# Patient Record
Sex: Female | Born: 1940 | Hispanic: No | Marital: Married | Smoking: Former smoker
Health system: Southern US, Community
[De-identification: ages and names within clinical notes are randomized; demographics above are authoritative.]

## PROBLEM LIST (undated history)

## (undated) DIAGNOSIS — I10 Essential (primary) hypertension: Secondary | ICD-10-CM

## (undated) DIAGNOSIS — Z8719 Personal history of other diseases of the digestive system: Secondary | ICD-10-CM

## (undated) DIAGNOSIS — E119 Type 2 diabetes mellitus without complications: Secondary | ICD-10-CM

## (undated) DIAGNOSIS — F32A Depression, unspecified: Secondary | ICD-10-CM

## (undated) DIAGNOSIS — E669 Obesity, unspecified: Secondary | ICD-10-CM

## (undated) DIAGNOSIS — F419 Anxiety disorder, unspecified: Secondary | ICD-10-CM

## (undated) DIAGNOSIS — K219 Gastro-esophageal reflux disease without esophagitis: Secondary | ICD-10-CM

## (undated) DIAGNOSIS — F0390 Unspecified dementia without behavioral disturbance: Secondary | ICD-10-CM

## (undated) DIAGNOSIS — G4700 Insomnia, unspecified: Secondary | ICD-10-CM

## (undated) DIAGNOSIS — J449 Chronic obstructive pulmonary disease, unspecified: Secondary | ICD-10-CM

## (undated) HISTORY — DX: Essential (primary) hypertension: I10

## (undated) HISTORY — DX: Obesity, unspecified: E66.9

## (undated) HISTORY — DX: Type 2 diabetes mellitus without complications: E11.9

## (undated) HISTORY — PX: TOTAL ABDOMINAL HYSTERECTOMY W/ BILATERAL SALPINGOOPHORECTOMY: SHX83

## (undated) HISTORY — DX: Unspecified dementia, unspecified severity, without behavioral disturbance, psychotic disturbance, mood disturbance, and anxiety: F03.90

## (undated) HISTORY — DX: Insomnia, unspecified: G47.00

## (undated) HISTORY — DX: Anxiety disorder, unspecified: F41.9

## (undated) HISTORY — DX: Personal history of other diseases of the digestive system: Z87.19

## (undated) HISTORY — DX: Gastro-esophageal reflux disease without esophagitis: K21.9

## (undated) HISTORY — PX: APPENDECTOMY: SHX54

## (undated) HISTORY — DX: Chronic obstructive pulmonary disease, unspecified: J44.9

## (undated) HISTORY — DX: Depression, unspecified: F32.A

---

## 2010-01-08 HISTORY — PX: ESOPHAGOGASTRODUODENOSCOPY: SHX1529

## 2010-01-10 HISTORY — PX: COLONOSCOPY: SHX174

## 2016-06-16 DIAGNOSIS — E119 Type 2 diabetes mellitus without complications: Secondary | ICD-10-CM | POA: Diagnosis not present

## 2016-06-16 DIAGNOSIS — E785 Hyperlipidemia, unspecified: Secondary | ICD-10-CM | POA: Diagnosis not present

## 2016-06-16 DIAGNOSIS — I10 Essential (primary) hypertension: Secondary | ICD-10-CM | POA: Diagnosis not present

## 2016-06-16 DIAGNOSIS — R413 Other amnesia: Secondary | ICD-10-CM | POA: Diagnosis not present

## 2016-06-16 DIAGNOSIS — I639 Cerebral infarction, unspecified: Secondary | ICD-10-CM | POA: Diagnosis not present

## 2016-06-16 DIAGNOSIS — J301 Allergic rhinitis due to pollen: Secondary | ICD-10-CM | POA: Diagnosis not present

## 2016-06-16 DIAGNOSIS — K210 Gastro-esophageal reflux disease with esophagitis: Secondary | ICD-10-CM | POA: Diagnosis not present

## 2016-06-16 DIAGNOSIS — Z1231 Encounter for screening mammogram for malignant neoplasm of breast: Secondary | ICD-10-CM | POA: Diagnosis not present

## 2016-06-16 DIAGNOSIS — E063 Autoimmune thyroiditis: Secondary | ICD-10-CM | POA: Diagnosis not present

## 2016-12-16 DIAGNOSIS — E119 Type 2 diabetes mellitus without complications: Secondary | ICD-10-CM | POA: Diagnosis not present

## 2016-12-16 DIAGNOSIS — Z6835 Body mass index (BMI) 35.0-35.9, adult: Secondary | ICD-10-CM | POA: Diagnosis not present

## 2016-12-16 DIAGNOSIS — E669 Obesity, unspecified: Secondary | ICD-10-CM | POA: Diagnosis not present

## 2016-12-16 DIAGNOSIS — I10 Essential (primary) hypertension: Secondary | ICD-10-CM | POA: Diagnosis not present

## 2016-12-16 DIAGNOSIS — K210 Gastro-esophageal reflux disease with esophagitis: Secondary | ICD-10-CM | POA: Diagnosis not present

## 2016-12-16 DIAGNOSIS — E785 Hyperlipidemia, unspecified: Secondary | ICD-10-CM | POA: Diagnosis not present

## 2016-12-16 DIAGNOSIS — Z23 Encounter for immunization: Secondary | ICD-10-CM | POA: Diagnosis not present

## 2016-12-16 DIAGNOSIS — R413 Other amnesia: Secondary | ICD-10-CM | POA: Diagnosis not present

## 2016-12-16 DIAGNOSIS — E063 Autoimmune thyroiditis: Secondary | ICD-10-CM | POA: Diagnosis not present

## 2016-12-18 DIAGNOSIS — Z1231 Encounter for screening mammogram for malignant neoplasm of breast: Secondary | ICD-10-CM | POA: Diagnosis not present

## 2017-04-12 DIAGNOSIS — T148XXA Other injury of unspecified body region, initial encounter: Secondary | ICD-10-CM | POA: Diagnosis not present

## 2017-04-12 DIAGNOSIS — S20219A Contusion of unspecified front wall of thorax, initial encounter: Secondary | ICD-10-CM | POA: Diagnosis not present

## 2017-04-12 DIAGNOSIS — S161XXA Strain of muscle, fascia and tendon at neck level, initial encounter: Secondary | ICD-10-CM | POA: Diagnosis not present

## 2017-04-12 DIAGNOSIS — G44209 Tension-type headache, unspecified, not intractable: Secondary | ICD-10-CM | POA: Diagnosis not present

## 2017-04-12 DIAGNOSIS — M25569 Pain in unspecified knee: Secondary | ICD-10-CM | POA: Diagnosis not present

## 2017-04-12 DIAGNOSIS — S20212A Contusion of left front wall of thorax, initial encounter: Secondary | ICD-10-CM | POA: Diagnosis not present

## 2017-04-16 DIAGNOSIS — Z79899 Other long term (current) drug therapy: Secondary | ICD-10-CM | POA: Diagnosis not present

## 2017-04-16 DIAGNOSIS — M818 Other osteoporosis without current pathological fracture: Secondary | ICD-10-CM | POA: Diagnosis not present

## 2017-04-16 DIAGNOSIS — K210 Gastro-esophageal reflux disease with esophagitis: Secondary | ICD-10-CM | POA: Diagnosis not present

## 2017-04-16 DIAGNOSIS — R413 Other amnesia: Secondary | ICD-10-CM | POA: Diagnosis not present

## 2017-04-16 DIAGNOSIS — E063 Autoimmune thyroiditis: Secondary | ICD-10-CM | POA: Diagnosis not present

## 2017-04-16 DIAGNOSIS — E119 Type 2 diabetes mellitus without complications: Secondary | ICD-10-CM | POA: Diagnosis not present

## 2017-04-16 DIAGNOSIS — E785 Hyperlipidemia, unspecified: Secondary | ICD-10-CM | POA: Diagnosis not present

## 2017-04-16 DIAGNOSIS — I10 Essential (primary) hypertension: Secondary | ICD-10-CM | POA: Diagnosis not present

## 2017-04-16 DIAGNOSIS — I6529 Occlusion and stenosis of unspecified carotid artery: Secondary | ICD-10-CM | POA: Diagnosis not present

## 2017-04-22 DIAGNOSIS — I6521 Occlusion and stenosis of right carotid artery: Secondary | ICD-10-CM | POA: Diagnosis not present

## 2017-04-22 DIAGNOSIS — I639 Cerebral infarction, unspecified: Secondary | ICD-10-CM | POA: Diagnosis not present

## 2017-04-30 DIAGNOSIS — I639 Cerebral infarction, unspecified: Secondary | ICD-10-CM | POA: Diagnosis not present

## 2017-04-30 DIAGNOSIS — I10 Essential (primary) hypertension: Secondary | ICD-10-CM | POA: Diagnosis not present

## 2017-04-30 DIAGNOSIS — R413 Other amnesia: Secondary | ICD-10-CM | POA: Diagnosis not present

## 2017-04-30 DIAGNOSIS — Z6831 Body mass index (BMI) 31.0-31.9, adult: Secondary | ICD-10-CM | POA: Diagnosis not present

## 2017-04-30 DIAGNOSIS — E785 Hyperlipidemia, unspecified: Secondary | ICD-10-CM | POA: Diagnosis not present

## 2017-04-30 DIAGNOSIS — E063 Autoimmune thyroiditis: Secondary | ICD-10-CM | POA: Diagnosis not present

## 2017-04-30 DIAGNOSIS — K210 Gastro-esophageal reflux disease with esophagitis: Secondary | ICD-10-CM | POA: Diagnosis not present

## 2017-05-06 DIAGNOSIS — R112 Nausea with vomiting, unspecified: Secondary | ICD-10-CM | POA: Diagnosis not present

## 2017-05-06 DIAGNOSIS — N3000 Acute cystitis without hematuria: Secondary | ICD-10-CM | POA: Diagnosis not present

## 2017-05-06 DIAGNOSIS — R51 Headache: Secondary | ICD-10-CM | POA: Diagnosis not present

## 2017-05-06 DIAGNOSIS — I10 Essential (primary) hypertension: Secondary | ICD-10-CM | POA: Diagnosis not present

## 2017-05-06 DIAGNOSIS — F0390 Unspecified dementia without behavioral disturbance: Secondary | ICD-10-CM | POA: Diagnosis not present

## 2017-05-06 DIAGNOSIS — K449 Diaphragmatic hernia without obstruction or gangrene: Secondary | ICD-10-CM | POA: Diagnosis not present

## 2017-05-10 DIAGNOSIS — F22 Delusional disorders: Secondary | ICD-10-CM | POA: Diagnosis not present

## 2017-05-10 DIAGNOSIS — N183 Chronic kidney disease, stage 3 (moderate): Secondary | ICD-10-CM | POA: Diagnosis not present

## 2017-05-10 DIAGNOSIS — E785 Hyperlipidemia, unspecified: Secondary | ICD-10-CM | POA: Diagnosis not present

## 2017-05-10 DIAGNOSIS — I639 Cerebral infarction, unspecified: Secondary | ICD-10-CM | POA: Diagnosis not present

## 2017-05-10 DIAGNOSIS — Z6830 Body mass index (BMI) 30.0-30.9, adult: Secondary | ICD-10-CM | POA: Diagnosis not present

## 2017-05-10 DIAGNOSIS — E063 Autoimmune thyroiditis: Secondary | ICD-10-CM | POA: Diagnosis not present

## 2017-05-10 DIAGNOSIS — R413 Other amnesia: Secondary | ICD-10-CM | POA: Diagnosis not present

## 2017-05-19 ENCOUNTER — Encounter: Payer: Self-pay | Admitting: Gastroenterology

## 2017-05-19 DIAGNOSIS — E538 Deficiency of other specified B group vitamins: Secondary | ICD-10-CM | POA: Diagnosis not present

## 2017-05-19 DIAGNOSIS — K210 Gastro-esophageal reflux disease with esophagitis: Secondary | ICD-10-CM | POA: Diagnosis not present

## 2017-05-19 DIAGNOSIS — D649 Anemia, unspecified: Secondary | ICD-10-CM | POA: Diagnosis not present

## 2017-05-19 DIAGNOSIS — E1140 Type 2 diabetes mellitus with diabetic neuropathy, unspecified: Secondary | ICD-10-CM | POA: Diagnosis not present

## 2017-05-19 DIAGNOSIS — R413 Other amnesia: Secondary | ICD-10-CM | POA: Diagnosis not present

## 2017-05-19 DIAGNOSIS — E063 Autoimmune thyroiditis: Secondary | ICD-10-CM | POA: Diagnosis not present

## 2017-05-19 DIAGNOSIS — I10 Essential (primary) hypertension: Secondary | ICD-10-CM | POA: Diagnosis not present

## 2017-05-19 DIAGNOSIS — E118 Type 2 diabetes mellitus with unspecified complications: Secondary | ICD-10-CM | POA: Diagnosis not present

## 2017-05-19 DIAGNOSIS — E785 Hyperlipidemia, unspecified: Secondary | ICD-10-CM | POA: Diagnosis not present

## 2017-05-19 DIAGNOSIS — F22 Delusional disorders: Secondary | ICD-10-CM | POA: Diagnosis not present

## 2017-05-19 DIAGNOSIS — N183 Chronic kidney disease, stage 3 (moderate): Secondary | ICD-10-CM | POA: Diagnosis not present

## 2017-06-22 DIAGNOSIS — E063 Autoimmune thyroiditis: Secondary | ICD-10-CM | POA: Diagnosis not present

## 2017-06-22 DIAGNOSIS — E118 Type 2 diabetes mellitus with unspecified complications: Secondary | ICD-10-CM | POA: Diagnosis not present

## 2017-06-22 DIAGNOSIS — D649 Anemia, unspecified: Secondary | ICD-10-CM | POA: Diagnosis not present

## 2017-06-22 DIAGNOSIS — E1140 Type 2 diabetes mellitus with diabetic neuropathy, unspecified: Secondary | ICD-10-CM | POA: Diagnosis not present

## 2017-06-22 DIAGNOSIS — E785 Hyperlipidemia, unspecified: Secondary | ICD-10-CM | POA: Diagnosis not present

## 2017-06-22 DIAGNOSIS — K210 Gastro-esophageal reflux disease with esophagitis: Secondary | ICD-10-CM | POA: Diagnosis not present

## 2017-06-22 DIAGNOSIS — I10 Essential (primary) hypertension: Secondary | ICD-10-CM | POA: Diagnosis not present

## 2017-06-22 DIAGNOSIS — F22 Delusional disorders: Secondary | ICD-10-CM | POA: Diagnosis not present

## 2017-06-22 DIAGNOSIS — N183 Chronic kidney disease, stage 3 (moderate): Secondary | ICD-10-CM | POA: Diagnosis not present

## 2017-06-22 DIAGNOSIS — Z79899 Other long term (current) drug therapy: Secondary | ICD-10-CM | POA: Diagnosis not present

## 2017-07-07 DIAGNOSIS — Z9114 Patient's other noncompliance with medication regimen: Secondary | ICD-10-CM | POA: Diagnosis not present

## 2017-07-07 DIAGNOSIS — M7989 Other specified soft tissue disorders: Secondary | ICD-10-CM | POA: Diagnosis not present

## 2017-07-07 DIAGNOSIS — I10 Essential (primary) hypertension: Secondary | ICD-10-CM | POA: Diagnosis not present

## 2017-07-07 DIAGNOSIS — I82432 Acute embolism and thrombosis of left popliteal vein: Secondary | ICD-10-CM | POA: Diagnosis not present

## 2017-07-07 DIAGNOSIS — I82402 Acute embolism and thrombosis of unspecified deep veins of left lower extremity: Secondary | ICD-10-CM | POA: Diagnosis not present

## 2017-07-07 DIAGNOSIS — I82412 Acute embolism and thrombosis of left femoral vein: Secondary | ICD-10-CM | POA: Diagnosis not present

## 2017-07-07 DIAGNOSIS — I160 Hypertensive urgency: Secondary | ICD-10-CM | POA: Diagnosis not present

## 2017-07-07 DIAGNOSIS — F0391 Unspecified dementia with behavioral disturbance: Secondary | ICD-10-CM | POA: Diagnosis not present

## 2017-07-08 DIAGNOSIS — R0602 Shortness of breath: Secondary | ICD-10-CM | POA: Diagnosis not present

## 2017-07-27 DIAGNOSIS — Z6832 Body mass index (BMI) 32.0-32.9, adult: Secondary | ICD-10-CM | POA: Diagnosis not present

## 2017-07-27 DIAGNOSIS — E063 Autoimmune thyroiditis: Secondary | ICD-10-CM | POA: Diagnosis not present

## 2017-07-27 DIAGNOSIS — N183 Chronic kidney disease, stage 3 (moderate): Secondary | ICD-10-CM | POA: Diagnosis not present

## 2017-07-27 DIAGNOSIS — E538 Deficiency of other specified B group vitamins: Secondary | ICD-10-CM | POA: Diagnosis not present

## 2017-07-27 DIAGNOSIS — R413 Other amnesia: Secondary | ICD-10-CM | POA: Diagnosis not present

## 2017-07-27 DIAGNOSIS — E559 Vitamin D deficiency, unspecified: Secondary | ICD-10-CM | POA: Diagnosis not present

## 2017-07-27 DIAGNOSIS — Z8673 Personal history of transient ischemic attack (TIA), and cerebral infarction without residual deficits: Secondary | ICD-10-CM | POA: Diagnosis not present

## 2017-07-27 DIAGNOSIS — E118 Type 2 diabetes mellitus with unspecified complications: Secondary | ICD-10-CM | POA: Diagnosis not present

## 2017-07-27 DIAGNOSIS — E785 Hyperlipidemia, unspecified: Secondary | ICD-10-CM | POA: Diagnosis not present

## 2017-07-27 DIAGNOSIS — F22 Delusional disorders: Secondary | ICD-10-CM | POA: Diagnosis not present

## 2017-12-13 DIAGNOSIS — N183 Chronic kidney disease, stage 3 (moderate): Secondary | ICD-10-CM | POA: Diagnosis not present

## 2017-12-13 DIAGNOSIS — F22 Delusional disorders: Secondary | ICD-10-CM | POA: Diagnosis not present

## 2017-12-13 DIAGNOSIS — E669 Obesity, unspecified: Secondary | ICD-10-CM | POA: Diagnosis not present

## 2017-12-13 DIAGNOSIS — E118 Type 2 diabetes mellitus with unspecified complications: Secondary | ICD-10-CM | POA: Diagnosis not present

## 2017-12-13 DIAGNOSIS — Z23 Encounter for immunization: Secondary | ICD-10-CM | POA: Diagnosis not present

## 2017-12-13 DIAGNOSIS — K210 Gastro-esophageal reflux disease with esophagitis: Secondary | ICD-10-CM | POA: Diagnosis not present

## 2017-12-13 DIAGNOSIS — E063 Autoimmune thyroiditis: Secondary | ICD-10-CM | POA: Diagnosis not present

## 2017-12-13 DIAGNOSIS — K922 Gastrointestinal hemorrhage, unspecified: Secondary | ICD-10-CM | POA: Diagnosis not present

## 2017-12-13 DIAGNOSIS — I82409 Acute embolism and thrombosis of unspecified deep veins of unspecified lower extremity: Secondary | ICD-10-CM | POA: Diagnosis not present

## 2017-12-13 DIAGNOSIS — E785 Hyperlipidemia, unspecified: Secondary | ICD-10-CM | POA: Diagnosis not present

## 2018-03-17 DIAGNOSIS — E063 Autoimmune thyroiditis: Secondary | ICD-10-CM | POA: Diagnosis not present

## 2018-03-17 DIAGNOSIS — E118 Type 2 diabetes mellitus with unspecified complications: Secondary | ICD-10-CM | POA: Diagnosis not present

## 2018-03-17 DIAGNOSIS — I82409 Acute embolism and thrombosis of unspecified deep veins of unspecified lower extremity: Secondary | ICD-10-CM | POA: Diagnosis not present

## 2018-03-17 DIAGNOSIS — K922 Gastrointestinal hemorrhage, unspecified: Secondary | ICD-10-CM | POA: Diagnosis not present

## 2018-03-17 DIAGNOSIS — E785 Hyperlipidemia, unspecified: Secondary | ICD-10-CM | POA: Diagnosis not present

## 2018-03-17 DIAGNOSIS — I10 Essential (primary) hypertension: Secondary | ICD-10-CM | POA: Diagnosis not present

## 2018-03-17 DIAGNOSIS — R413 Other amnesia: Secondary | ICD-10-CM | POA: Diagnosis not present

## 2018-03-17 DIAGNOSIS — Z6834 Body mass index (BMI) 34.0-34.9, adult: Secondary | ICD-10-CM | POA: Diagnosis not present

## 2018-06-20 DIAGNOSIS — E785 Hyperlipidemia, unspecified: Secondary | ICD-10-CM | POA: Diagnosis not present

## 2018-06-20 DIAGNOSIS — I10 Essential (primary) hypertension: Secondary | ICD-10-CM | POA: Diagnosis not present

## 2018-06-20 DIAGNOSIS — K210 Gastro-esophageal reflux disease with esophagitis: Secondary | ICD-10-CM | POA: Diagnosis not present

## 2018-06-20 DIAGNOSIS — Z1331 Encounter for screening for depression: Secondary | ICD-10-CM | POA: Diagnosis not present

## 2018-06-20 DIAGNOSIS — K922 Gastrointestinal hemorrhage, unspecified: Secondary | ICD-10-CM | POA: Diagnosis not present

## 2018-06-20 DIAGNOSIS — E063 Autoimmune thyroiditis: Secondary | ICD-10-CM | POA: Diagnosis not present

## 2018-06-20 DIAGNOSIS — F22 Delusional disorders: Secondary | ICD-10-CM | POA: Diagnosis not present

## 2018-06-20 DIAGNOSIS — E118 Type 2 diabetes mellitus with unspecified complications: Secondary | ICD-10-CM | POA: Diagnosis not present

## 2018-06-20 DIAGNOSIS — Z9181 History of falling: Secondary | ICD-10-CM | POA: Diagnosis not present

## 2018-07-21 DIAGNOSIS — N183 Chronic kidney disease, stage 3 (moderate): Secondary | ICD-10-CM | POA: Diagnosis not present

## 2018-07-21 DIAGNOSIS — I82409 Acute embolism and thrombosis of unspecified deep veins of unspecified lower extremity: Secondary | ICD-10-CM | POA: Diagnosis not present

## 2018-07-21 DIAGNOSIS — E063 Autoimmune thyroiditis: Secondary | ICD-10-CM | POA: Diagnosis not present

## 2018-07-21 DIAGNOSIS — K922 Gastrointestinal hemorrhage, unspecified: Secondary | ICD-10-CM | POA: Diagnosis not present

## 2018-07-21 DIAGNOSIS — F22 Delusional disorders: Secondary | ICD-10-CM | POA: Diagnosis not present

## 2018-07-21 DIAGNOSIS — K210 Gastro-esophageal reflux disease with esophagitis: Secondary | ICD-10-CM | POA: Diagnosis not present

## 2018-07-21 DIAGNOSIS — E538 Deficiency of other specified B group vitamins: Secondary | ICD-10-CM | POA: Diagnosis not present

## 2018-07-21 DIAGNOSIS — E785 Hyperlipidemia, unspecified: Secondary | ICD-10-CM | POA: Diagnosis not present

## 2018-07-21 DIAGNOSIS — E118 Type 2 diabetes mellitus with unspecified complications: Secondary | ICD-10-CM | POA: Diagnosis not present

## 2018-07-21 DIAGNOSIS — R413 Other amnesia: Secondary | ICD-10-CM | POA: Diagnosis not present

## 2018-07-21 DIAGNOSIS — M25562 Pain in left knee: Secondary | ICD-10-CM | POA: Diagnosis not present

## 2018-07-22 DIAGNOSIS — I82409 Acute embolism and thrombosis of unspecified deep veins of unspecified lower extremity: Secondary | ICD-10-CM | POA: Diagnosis not present

## 2018-07-22 DIAGNOSIS — M79605 Pain in left leg: Secondary | ICD-10-CM | POA: Diagnosis not present

## 2018-10-24 DIAGNOSIS — N183 Chronic kidney disease, stage 3 (moderate): Secondary | ICD-10-CM | POA: Diagnosis not present

## 2018-10-24 DIAGNOSIS — F22 Delusional disorders: Secondary | ICD-10-CM | POA: Diagnosis not present

## 2018-10-24 DIAGNOSIS — K922 Gastrointestinal hemorrhage, unspecified: Secondary | ICD-10-CM | POA: Diagnosis not present

## 2018-10-24 DIAGNOSIS — R413 Other amnesia: Secondary | ICD-10-CM | POA: Diagnosis not present

## 2018-10-24 DIAGNOSIS — E538 Deficiency of other specified B group vitamins: Secondary | ICD-10-CM | POA: Diagnosis not present

## 2018-10-24 DIAGNOSIS — D649 Anemia, unspecified: Secondary | ICD-10-CM | POA: Diagnosis not present

## 2018-10-24 DIAGNOSIS — E063 Autoimmune thyroiditis: Secondary | ICD-10-CM | POA: Diagnosis not present

## 2018-10-24 DIAGNOSIS — E785 Hyperlipidemia, unspecified: Secondary | ICD-10-CM | POA: Diagnosis not present

## 2018-10-24 DIAGNOSIS — E118 Type 2 diabetes mellitus with unspecified complications: Secondary | ICD-10-CM | POA: Diagnosis not present

## 2018-10-25 DIAGNOSIS — E113293 Type 2 diabetes mellitus with mild nonproliferative diabetic retinopathy without macular edema, bilateral: Secondary | ICD-10-CM | POA: Diagnosis not present

## 2019-01-24 DIAGNOSIS — N183 Chronic kidney disease, stage 3 unspecified: Secondary | ICD-10-CM | POA: Diagnosis not present

## 2019-01-24 DIAGNOSIS — E785 Hyperlipidemia, unspecified: Secondary | ICD-10-CM | POA: Diagnosis not present

## 2019-01-24 DIAGNOSIS — D649 Anemia, unspecified: Secondary | ICD-10-CM | POA: Diagnosis not present

## 2019-01-24 DIAGNOSIS — E063 Autoimmune thyroiditis: Secondary | ICD-10-CM | POA: Diagnosis not present

## 2019-01-24 DIAGNOSIS — I10 Essential (primary) hypertension: Secondary | ICD-10-CM | POA: Diagnosis not present

## 2019-01-24 DIAGNOSIS — R630 Anorexia: Secondary | ICD-10-CM | POA: Diagnosis not present

## 2019-01-24 DIAGNOSIS — Z8673 Personal history of transient ischemic attack (TIA), and cerebral infarction without residual deficits: Secondary | ICD-10-CM | POA: Diagnosis not present

## 2019-01-24 DIAGNOSIS — K922 Gastrointestinal hemorrhage, unspecified: Secondary | ICD-10-CM | POA: Diagnosis not present

## 2019-01-24 DIAGNOSIS — E118 Type 2 diabetes mellitus with unspecified complications: Secondary | ICD-10-CM | POA: Diagnosis not present

## 2019-01-31 DIAGNOSIS — E063 Autoimmune thyroiditis: Secondary | ICD-10-CM | POA: Diagnosis not present

## 2019-01-31 DIAGNOSIS — E118 Type 2 diabetes mellitus with unspecified complications: Secondary | ICD-10-CM | POA: Diagnosis not present

## 2019-01-31 DIAGNOSIS — Z23 Encounter for immunization: Secondary | ICD-10-CM | POA: Diagnosis not present

## 2019-01-31 DIAGNOSIS — E559 Vitamin D deficiency, unspecified: Secondary | ICD-10-CM | POA: Diagnosis not present

## 2019-01-31 DIAGNOSIS — E785 Hyperlipidemia, unspecified: Secondary | ICD-10-CM | POA: Diagnosis not present

## 2019-01-31 DIAGNOSIS — Z79899 Other long term (current) drug therapy: Secondary | ICD-10-CM | POA: Diagnosis not present

## 2019-04-28 ENCOUNTER — Ambulatory Visit: Payer: Self-pay | Attending: Internal Medicine

## 2019-04-28 DIAGNOSIS — Z23 Encounter for immunization: Secondary | ICD-10-CM | POA: Insufficient documentation

## 2019-04-28 NOTE — Progress Notes (Signed)
   Covid-19 Vaccination Clinic  Name:  Judyth Demarais    MRN: [id number] DOB: [birth year]  04/28/2019  Ms. Merriott was observed post Covid-19 immunization for 15 minutes without incidence. She was provided with Vaccine Information Sheet and instruction to access the V-Safe system.   Ms. Justiniano was instructed to call 911 with any severe reactions post vaccine: Marland Kitchen Difficulty breathing  . Swelling of your face and throat  . A fast heartbeat  . A bad rash all over your body  . Dizziness and weakness    Immunizations Administered    Name Date Dose VIS Date Route   Pfizer COVID-19 Vaccine 04/28/2019  1:11 PM 0.3 mL 02/10/2019 Intramuscular   Manufacturer: [employer], [employer]   Lot: [id number]   NDC: [address]-5

## 2019-05-03 DIAGNOSIS — E559 Vitamin D deficiency, unspecified: Secondary | ICD-10-CM | POA: Diagnosis not present

## 2019-05-03 DIAGNOSIS — E118 Type 2 diabetes mellitus with unspecified complications: Secondary | ICD-10-CM | POA: Diagnosis not present

## 2019-05-03 DIAGNOSIS — Z0000 Encounter for general adult medical examination without abnormal findings: Secondary | ICD-10-CM | POA: Diagnosis not present

## 2019-05-03 DIAGNOSIS — Z139 Encounter for screening, unspecified: Secondary | ICD-10-CM | POA: Diagnosis not present

## 2019-05-03 DIAGNOSIS — E785 Hyperlipidemia, unspecified: Secondary | ICD-10-CM | POA: Diagnosis not present

## 2019-05-03 DIAGNOSIS — M25562 Pain in left knee: Secondary | ICD-10-CM | POA: Diagnosis not present

## 2019-05-03 DIAGNOSIS — F22 Delusional disorders: Secondary | ICD-10-CM | POA: Diagnosis not present

## 2019-05-03 DIAGNOSIS — E538 Deficiency of other specified B group vitamins: Secondary | ICD-10-CM | POA: Diagnosis not present

## 2019-05-03 DIAGNOSIS — Z79899 Other long term (current) drug therapy: Secondary | ICD-10-CM | POA: Diagnosis not present

## 2019-05-03 DIAGNOSIS — K922 Gastrointestinal hemorrhage, unspecified: Secondary | ICD-10-CM | POA: Diagnosis not present

## 2019-05-03 DIAGNOSIS — E063 Autoimmune thyroiditis: Secondary | ICD-10-CM | POA: Diagnosis not present

## 2019-05-03 DIAGNOSIS — R413 Other amnesia: Secondary | ICD-10-CM | POA: Diagnosis not present

## 2019-05-08 DIAGNOSIS — M1712 Unilateral primary osteoarthritis, left knee: Secondary | ICD-10-CM | POA: Diagnosis not present

## 2019-05-08 DIAGNOSIS — Z1231 Encounter for screening mammogram for malignant neoplasm of breast: Secondary | ICD-10-CM | POA: Diagnosis not present

## 2019-05-08 DIAGNOSIS — M25562 Pain in left knee: Secondary | ICD-10-CM | POA: Diagnosis not present

## 2019-05-10 DIAGNOSIS — M1712 Unilateral primary osteoarthritis, left knee: Secondary | ICD-10-CM | POA: Diagnosis not present

## 2019-05-10 DIAGNOSIS — M25562 Pain in left knee: Secondary | ICD-10-CM | POA: Diagnosis not present

## 2019-05-17 DIAGNOSIS — M1712 Unilateral primary osteoarthritis, left knee: Secondary | ICD-10-CM | POA: Diagnosis not present

## 2019-05-17 DIAGNOSIS — M25562 Pain in left knee: Secondary | ICD-10-CM | POA: Diagnosis not present

## 2019-05-30 ENCOUNTER — Ambulatory Visit: Payer: Self-pay | Attending: Internal Medicine

## 2019-05-30 DIAGNOSIS — Z23 Encounter for immunization: Secondary | ICD-10-CM

## 2019-05-30 NOTE — Progress Notes (Signed)
   Covid-19 Vaccination Clinic  Name:  Jaylenne Hamelin    MRN: [id number] DOB: [birth year]  05/30/2019  Ms. Nieman was observed post Covid-19 immunization for 15 minutes without incident. She was provided with Vaccine Information Sheet and instruction to access the V-Safe system.   Ms. Wass was instructed to call 911 with any severe reactions post vaccine: Marland Kitchen Difficulty breathing  . Swelling of face and throat  . A fast heartbeat  . A bad rash all over body  . Dizziness and weakness   Immunizations Administered    Name Date Dose VIS Date Route   Pfizer COVID-19 Vaccine 05/30/2019  4:50 PM 0.3 mL 02/10/2019 Intramuscular   Manufacturer: [employer], [employer]   Lot: [id number]   NDC: [address]-8

## 2019-05-31 DIAGNOSIS — M1712 Unilateral primary osteoarthritis, left knee: Secondary | ICD-10-CM | POA: Diagnosis not present

## 2019-05-31 DIAGNOSIS — M25562 Pain in left knee: Secondary | ICD-10-CM | POA: Diagnosis not present

## 2019-08-03 DIAGNOSIS — Z1331 Encounter for screening for depression: Secondary | ICD-10-CM | POA: Diagnosis not present

## 2019-08-03 DIAGNOSIS — E785 Hyperlipidemia, unspecified: Secondary | ICD-10-CM | POA: Diagnosis not present

## 2019-08-03 DIAGNOSIS — E538 Deficiency of other specified B group vitamins: Secondary | ICD-10-CM | POA: Diagnosis not present

## 2019-08-03 DIAGNOSIS — Z9181 History of falling: Secondary | ICD-10-CM | POA: Diagnosis not present

## 2019-08-03 DIAGNOSIS — E559 Vitamin D deficiency, unspecified: Secondary | ICD-10-CM | POA: Diagnosis not present

## 2019-08-03 DIAGNOSIS — K922 Gastrointestinal hemorrhage, unspecified: Secondary | ICD-10-CM | POA: Diagnosis not present

## 2019-08-03 DIAGNOSIS — R413 Other amnesia: Secondary | ICD-10-CM | POA: Diagnosis not present

## 2019-08-03 DIAGNOSIS — F22 Delusional disorders: Secondary | ICD-10-CM | POA: Diagnosis not present

## 2019-08-03 DIAGNOSIS — E118 Type 2 diabetes mellitus with unspecified complications: Secondary | ICD-10-CM | POA: Diagnosis not present

## 2019-08-03 DIAGNOSIS — E063 Autoimmune thyroiditis: Secondary | ICD-10-CM | POA: Diagnosis not present

## 2019-08-08 DIAGNOSIS — G459 Transient cerebral ischemic attack, unspecified: Secondary | ICD-10-CM | POA: Diagnosis not present

## 2019-08-10 DIAGNOSIS — F0391 Unspecified dementia with behavioral disturbance: Secondary | ICD-10-CM | POA: Diagnosis not present

## 2019-08-17 DIAGNOSIS — R262 Difficulty in walking, not elsewhere classified: Secondary | ICD-10-CM | POA: Diagnosis not present

## 2019-11-03 DIAGNOSIS — Z6828 Body mass index (BMI) 28.0-28.9, adult: Secondary | ICD-10-CM | POA: Diagnosis not present

## 2019-11-03 DIAGNOSIS — Z8673 Personal history of transient ischemic attack (TIA), and cerebral infarction without residual deficits: Secondary | ICD-10-CM | POA: Diagnosis not present

## 2019-11-03 DIAGNOSIS — R413 Other amnesia: Secondary | ICD-10-CM | POA: Diagnosis not present

## 2019-11-03 DIAGNOSIS — E538 Deficiency of other specified B group vitamins: Secondary | ICD-10-CM | POA: Diagnosis not present

## 2019-11-03 DIAGNOSIS — E063 Autoimmune thyroiditis: Secondary | ICD-10-CM | POA: Diagnosis not present

## 2019-11-03 DIAGNOSIS — E118 Type 2 diabetes mellitus with unspecified complications: Secondary | ICD-10-CM | POA: Diagnosis not present

## 2019-11-03 DIAGNOSIS — Z79899 Other long term (current) drug therapy: Secondary | ICD-10-CM | POA: Diagnosis not present

## 2019-11-03 DIAGNOSIS — E559 Vitamin D deficiency, unspecified: Secondary | ICD-10-CM | POA: Diagnosis not present

## 2019-11-03 DIAGNOSIS — E785 Hyperlipidemia, unspecified: Secondary | ICD-10-CM | POA: Diagnosis not present

## 2019-11-16 DIAGNOSIS — F0391 Unspecified dementia with behavioral disturbance: Secondary | ICD-10-CM | POA: Diagnosis not present

## 2020-02-01 DIAGNOSIS — F0391 Unspecified dementia with behavioral disturbance: Secondary | ICD-10-CM | POA: Diagnosis not present

## 2020-02-02 DIAGNOSIS — N183 Chronic kidney disease, stage 3 unspecified: Secondary | ICD-10-CM | POA: Diagnosis not present

## 2020-02-02 DIAGNOSIS — Z23 Encounter for immunization: Secondary | ICD-10-CM | POA: Diagnosis not present

## 2020-02-02 DIAGNOSIS — R413 Other amnesia: Secondary | ICD-10-CM | POA: Diagnosis not present

## 2020-02-02 DIAGNOSIS — Z6826 Body mass index (BMI) 26.0-26.9, adult: Secondary | ICD-10-CM | POA: Diagnosis not present

## 2020-02-02 DIAGNOSIS — E559 Vitamin D deficiency, unspecified: Secondary | ICD-10-CM | POA: Diagnosis not present

## 2020-02-02 DIAGNOSIS — E118 Type 2 diabetes mellitus with unspecified complications: Secondary | ICD-10-CM | POA: Diagnosis not present

## 2020-02-02 DIAGNOSIS — M25562 Pain in left knee: Secondary | ICD-10-CM | POA: Diagnosis not present

## 2020-02-02 DIAGNOSIS — E785 Hyperlipidemia, unspecified: Secondary | ICD-10-CM | POA: Diagnosis not present

## 2020-02-02 DIAGNOSIS — E063 Autoimmune thyroiditis: Secondary | ICD-10-CM | POA: Diagnosis not present

## 2020-02-12 DIAGNOSIS — M1712 Unilateral primary osteoarthritis, left knee: Secondary | ICD-10-CM | POA: Diagnosis not present

## 2020-03-14 DIAGNOSIS — F0391 Unspecified dementia with behavioral disturbance: Secondary | ICD-10-CM | POA: Diagnosis not present

## 2020-03-22 DIAGNOSIS — R8290 Unspecified abnormal findings in urine: Secondary | ICD-10-CM | POA: Diagnosis not present

## 2020-03-22 DIAGNOSIS — R300 Dysuria: Secondary | ICD-10-CM | POA: Diagnosis not present

## 2020-05-09 DIAGNOSIS — R8281 Pyuria: Secondary | ICD-10-CM | POA: Diagnosis not present

## 2020-05-09 DIAGNOSIS — R5383 Other fatigue: Secondary | ICD-10-CM | POA: Diagnosis not present

## 2020-05-09 DIAGNOSIS — E1165 Type 2 diabetes mellitus with hyperglycemia: Secondary | ICD-10-CM | POA: Diagnosis not present

## 2020-05-09 DIAGNOSIS — R7989 Other specified abnormal findings of blood chemistry: Secondary | ICD-10-CM | POA: Diagnosis not present

## 2020-05-09 DIAGNOSIS — B3741 Candidal cystitis and urethritis: Secondary | ICD-10-CM | POA: Diagnosis not present

## 2020-05-09 DIAGNOSIS — D72829 Elevated white blood cell count, unspecified: Secondary | ICD-10-CM | POA: Diagnosis not present

## 2020-05-09 DIAGNOSIS — N179 Acute kidney failure, unspecified: Secondary | ICD-10-CM | POA: Diagnosis not present

## 2020-05-09 DIAGNOSIS — K5732 Diverticulitis of large intestine without perforation or abscess without bleeding: Secondary | ICD-10-CM | POA: Diagnosis not present

## 2020-05-09 DIAGNOSIS — I700 Atherosclerosis of aorta: Secondary | ICD-10-CM | POA: Diagnosis not present

## 2020-05-09 DIAGNOSIS — R531 Weakness: Secondary | ICD-10-CM | POA: Diagnosis not present

## 2020-05-09 DIAGNOSIS — I10 Essential (primary) hypertension: Secondary | ICD-10-CM | POA: Diagnosis not present

## 2020-05-09 DIAGNOSIS — R109 Unspecified abdominal pain: Secondary | ICD-10-CM | POA: Diagnosis not present

## 2020-05-09 DIAGNOSIS — I771 Stricture of artery: Secondary | ICD-10-CM | POA: Diagnosis not present

## 2020-05-09 DIAGNOSIS — R9431 Abnormal electrocardiogram [ECG] [EKG]: Secondary | ICD-10-CM | POA: Diagnosis not present

## 2020-05-10 DIAGNOSIS — N179 Acute kidney failure, unspecified: Secondary | ICD-10-CM | POA: Diagnosis not present

## 2020-05-10 DIAGNOSIS — K5732 Diverticulitis of large intestine without perforation or abscess without bleeding: Secondary | ICD-10-CM | POA: Diagnosis not present

## 2020-05-10 DIAGNOSIS — E039 Hypothyroidism, unspecified: Secondary | ICD-10-CM | POA: Diagnosis not present

## 2020-05-10 DIAGNOSIS — E119 Type 2 diabetes mellitus without complications: Secondary | ICD-10-CM | POA: Diagnosis not present

## 2020-05-10 DIAGNOSIS — I10 Essential (primary) hypertension: Secondary | ICD-10-CM | POA: Diagnosis not present

## 2020-05-10 DIAGNOSIS — F0281 Dementia in other diseases classified elsewhere with behavioral disturbance: Secondary | ICD-10-CM | POA: Diagnosis not present

## 2020-05-10 DIAGNOSIS — N3000 Acute cystitis without hematuria: Secondary | ICD-10-CM | POA: Diagnosis not present

## 2020-05-10 DIAGNOSIS — K219 Gastro-esophageal reflux disease without esophagitis: Secondary | ICD-10-CM | POA: Diagnosis not present

## 2020-05-11 DIAGNOSIS — F0281 Dementia in other diseases classified elsewhere with behavioral disturbance: Secondary | ICD-10-CM | POA: Diagnosis not present

## 2020-05-11 DIAGNOSIS — I10 Essential (primary) hypertension: Secondary | ICD-10-CM | POA: Diagnosis not present

## 2020-05-11 DIAGNOSIS — N179 Acute kidney failure, unspecified: Secondary | ICD-10-CM | POA: Diagnosis not present

## 2020-05-11 DIAGNOSIS — E039 Hypothyroidism, unspecified: Secondary | ICD-10-CM | POA: Diagnosis not present

## 2020-05-11 DIAGNOSIS — K219 Gastro-esophageal reflux disease without esophagitis: Secondary | ICD-10-CM | POA: Diagnosis not present

## 2020-05-11 DIAGNOSIS — N3000 Acute cystitis without hematuria: Secondary | ICD-10-CM | POA: Diagnosis not present

## 2020-05-11 DIAGNOSIS — E119 Type 2 diabetes mellitus without complications: Secondary | ICD-10-CM | POA: Diagnosis not present

## 2020-05-11 DIAGNOSIS — K5732 Diverticulitis of large intestine without perforation or abscess without bleeding: Secondary | ICD-10-CM | POA: Diagnosis not present

## 2020-05-16 DIAGNOSIS — Z79899 Other long term (current) drug therapy: Secondary | ICD-10-CM | POA: Diagnosis not present

## 2020-05-16 DIAGNOSIS — R5381 Other malaise: Secondary | ICD-10-CM | POA: Diagnosis not present

## 2020-05-16 DIAGNOSIS — I10 Essential (primary) hypertension: Secondary | ICD-10-CM | POA: Diagnosis not present

## 2020-05-16 DIAGNOSIS — R262 Difficulty in walking, not elsewhere classified: Secondary | ICD-10-CM | POA: Diagnosis not present

## 2020-05-16 DIAGNOSIS — N183 Chronic kidney disease, stage 3 unspecified: Secondary | ICD-10-CM | POA: Diagnosis not present

## 2020-05-16 DIAGNOSIS — R413 Other amnesia: Secondary | ICD-10-CM | POA: Diagnosis not present

## 2020-05-16 DIAGNOSIS — Z6825 Body mass index (BMI) 25.0-25.9, adult: Secondary | ICD-10-CM | POA: Diagnosis not present

## 2020-05-16 DIAGNOSIS — E785 Hyperlipidemia, unspecified: Secondary | ICD-10-CM | POA: Diagnosis not present

## 2020-05-16 DIAGNOSIS — K922 Gastrointestinal hemorrhage, unspecified: Secondary | ICD-10-CM | POA: Diagnosis not present

## 2020-05-23 DIAGNOSIS — F0391 Unspecified dementia with behavioral disturbance: Secondary | ICD-10-CM | POA: Diagnosis not present

## 2020-06-21 DIAGNOSIS — N390 Urinary tract infection, site not specified: Secondary | ICD-10-CM | POA: Diagnosis not present

## 2020-06-21 DIAGNOSIS — N183 Chronic kidney disease, stage 3 unspecified: Secondary | ICD-10-CM | POA: Diagnosis not present

## 2020-06-21 DIAGNOSIS — E118 Type 2 diabetes mellitus with unspecified complications: Secondary | ICD-10-CM | POA: Diagnosis not present

## 2020-06-21 DIAGNOSIS — E785 Hyperlipidemia, unspecified: Secondary | ICD-10-CM | POA: Diagnosis not present

## 2020-06-21 DIAGNOSIS — R413 Other amnesia: Secondary | ICD-10-CM | POA: Diagnosis not present

## 2020-06-21 DIAGNOSIS — I10 Essential (primary) hypertension: Secondary | ICD-10-CM | POA: Diagnosis not present

## 2020-06-21 DIAGNOSIS — Z6823 Body mass index (BMI) 23.0-23.9, adult: Secondary | ICD-10-CM | POA: Diagnosis not present

## 2020-06-21 DIAGNOSIS — E063 Autoimmune thyroiditis: Secondary | ICD-10-CM | POA: Diagnosis not present

## 2020-06-21 DIAGNOSIS — E559 Vitamin D deficiency, unspecified: Secondary | ICD-10-CM | POA: Diagnosis not present

## 2020-08-15 DIAGNOSIS — R269 Unspecified abnormalities of gait and mobility: Secondary | ICD-10-CM | POA: Diagnosis not present

## 2020-08-15 DIAGNOSIS — G4700 Insomnia, unspecified: Secondary | ICD-10-CM | POA: Diagnosis not present

## 2020-08-15 DIAGNOSIS — F0391 Unspecified dementia with behavioral disturbance: Secondary | ICD-10-CM | POA: Diagnosis not present

## 2020-08-15 DIAGNOSIS — R441 Visual hallucinations: Secondary | ICD-10-CM | POA: Diagnosis not present

## 2020-08-15 DIAGNOSIS — I6330 Cerebral infarction due to thrombosis of unspecified cerebral artery: Secondary | ICD-10-CM | POA: Diagnosis not present

## 2020-08-15 DIAGNOSIS — N179 Acute kidney failure, unspecified: Secondary | ICD-10-CM | POA: Diagnosis not present

## 2020-08-15 DIAGNOSIS — F0151 Vascular dementia with behavioral disturbance: Secondary | ICD-10-CM | POA: Diagnosis not present

## 2020-08-15 DIAGNOSIS — N3000 Acute cystitis without hematuria: Secondary | ICD-10-CM | POA: Diagnosis not present

## 2020-08-16 DIAGNOSIS — E118 Type 2 diabetes mellitus with unspecified complications: Secondary | ICD-10-CM | POA: Diagnosis not present

## 2020-08-16 DIAGNOSIS — R413 Other amnesia: Secondary | ICD-10-CM | POA: Diagnosis not present

## 2020-08-16 DIAGNOSIS — E559 Vitamin D deficiency, unspecified: Secondary | ICD-10-CM | POA: Diagnosis not present

## 2020-08-16 DIAGNOSIS — R5381 Other malaise: Secondary | ICD-10-CM | POA: Diagnosis not present

## 2020-08-16 DIAGNOSIS — Z1331 Encounter for screening for depression: Secondary | ICD-10-CM | POA: Diagnosis not present

## 2020-08-16 DIAGNOSIS — Z9181 History of falling: Secondary | ICD-10-CM | POA: Diagnosis not present

## 2020-08-16 DIAGNOSIS — I10 Essential (primary) hypertension: Secondary | ICD-10-CM | POA: Diagnosis not present

## 2020-08-16 DIAGNOSIS — E063 Autoimmune thyroiditis: Secondary | ICD-10-CM | POA: Diagnosis not present

## 2020-08-16 DIAGNOSIS — Z139 Encounter for screening, unspecified: Secondary | ICD-10-CM | POA: Diagnosis not present

## 2020-09-13 ENCOUNTER — Encounter: Payer: Self-pay | Admitting: Gastroenterology

## 2020-09-16 ENCOUNTER — Encounter: Payer: Self-pay | Admitting: Gastroenterology

## 2020-09-16 DIAGNOSIS — F0151 Vascular dementia with behavioral disturbance: Secondary | ICD-10-CM | POA: Diagnosis not present

## 2020-09-16 DIAGNOSIS — G4720 Circadian rhythm sleep disorder, unspecified type: Secondary | ICD-10-CM | POA: Diagnosis not present

## 2020-09-16 DIAGNOSIS — I6330 Cerebral infarction due to thrombosis of unspecified cerebral artery: Secondary | ICD-10-CM | POA: Diagnosis not present

## 2020-09-16 DIAGNOSIS — R451 Restlessness and agitation: Secondary | ICD-10-CM | POA: Diagnosis not present

## 2020-10-09 DIAGNOSIS — N390 Urinary tract infection, site not specified: Secondary | ICD-10-CM | POA: Diagnosis not present

## 2020-10-15 DIAGNOSIS — E113293 Type 2 diabetes mellitus with mild nonproliferative diabetic retinopathy without macular edema, bilateral: Secondary | ICD-10-CM | POA: Diagnosis not present

## 2020-10-15 DIAGNOSIS — H40039 Anatomical narrow angle, unspecified eye: Secondary | ICD-10-CM | POA: Diagnosis not present

## 2020-10-21 ENCOUNTER — Other Ambulatory Visit: Payer: Self-pay

## 2020-10-21 DIAGNOSIS — R69 Illness, unspecified: Secondary | ICD-10-CM | POA: Diagnosis not present

## 2020-10-22 ENCOUNTER — Encounter: Payer: Self-pay | Admitting: Gastroenterology

## 2020-10-22 ENCOUNTER — Other Ambulatory Visit: Payer: Self-pay

## 2020-10-22 ENCOUNTER — Ambulatory Visit (INDEPENDENT_AMBULATORY_CARE_PROVIDER_SITE_OTHER): Payer: Medicare HMO | Admitting: Gastroenterology

## 2020-10-22 VITALS — BP 156/78 | HR 64 | Ht 65.0 in | Wt 144.2 lb

## 2020-10-22 DIAGNOSIS — K5732 Diverticulitis of large intestine without perforation or abscess without bleeding: Secondary | ICD-10-CM

## 2020-10-22 NOTE — Patient Instructions (Addendum)
If you are age 79 or older, your body mass index should be between 23-30. Your Body mass index is 24 kg/m. If this is out of the aforementioned range listed, please consider follow up with your Primary Care Provider.  If you are age 14 or younger, your body mass index should be between 19-25. Your Body mass index is 24 kg/m. If this is out of the aformentioned range listed, please consider follow up with your Primary Care Provider.   __________________________________________________________  The [facility] providers would like to encourage you to use [facility] to communicate with providers for non-urgent requests or questions.  Due to long hold times on the telephone, sending your provider a message by [facility] may be a faster and more efficient way to get a response.  Please allow 48 business hours for a response.  Please remember that this is for non-urgent requests.   No repeat colon at this time. Please call with any questions or concerns.  Follow up as needed.  Thank you,  Dr. Lynann Bologna

## 2020-10-22 NOTE — Progress Notes (Signed)
Chief Complaint: Abn CT  Referring Provider:  Gordy Councilman, FNP      ASSESSMENT AND PLAN;   #1. H/O sigmoid diverticulitis 04/2020 on NCCT  #2. Multiple comorbid conditions including advanced age, dementia  Plan: -I discussed extensively with pt's daughter.  I have discussed regarding risks and benefits of colonoscopy. At the present time, she would like to hold off on any colonoscopy.  She does understand risks and benefits. -Given all contact numbers to the patient's daughter.  There is any bleeding, she would promptly get in touch with [place].   HPI:    Haley Sullivan is a 80 y.o. female  Accompanied by her daughter With multiple medical problems including dementia, DM2, GERD, HTN, advanced age  Adm @ [facility] 04/2020 with urosepsis.  Underwent CT Abdo/pelvis which showed acute uncomplicated sigmoid diverticulitis.  She was treated with Cipro and Flagyl with good results.  Comes to the GI clinic for possible consideration of colonoscopy as suggested by CT.  She denies having any significant GI problems except for occasional diarrhea with occasional incontinence attributed to metformin.  She denies having any nausea, vomiting, abdominal pain, fever or chills.  No recent weight loss.  No melena or hematochezia.  Review her hemoglobin was 12.5 05/09/2020.  Cologuard was attempted but could not be performed since patient was not able to follow instructions.  H/d div bleed Nov 2011 s/p 4U- adm to [facility].  None since.  Past GI work-up:  CT AP 04/2020 without contrast 1. Findings compatible with acute uncomplicated sigmoid diverticulitis. No pneumoperitoneum or significant free fluid. Given extent of mural thickening, recommend colonoscopy after resolution of acute symptoms if not recently performed.  2. Severe atherosclerosis.  3. Mild emphysema.  4. Mild anasarca.   Colonoscopy 12/2009 -Pancolonic diverticulosis -Patient had diverticular bleed 01/2020 (s/p 4U)  EGD  12/2009 -H. pylori gastritis -Treated with triple drug therapy. Past Medical History:  Diagnosis Date   Anxiety and depression    Dementia (HCC)    Diabetes (HCC)    GERD (gastroesophageal reflux disease)    History of GI diverticular bleed    Hypertension    Insomnia    Obesity     Past Surgical History:  Procedure Laterality Date   APPENDECTOMY     COLONOSCOPY  01/10/2010   Pancolonic divericulosis. The patient likely had a diverticular bleed which currently has resolved. There was no active bleeding   ESOPHAGOGASTRODUODENOSCOPY  01/08/2010   Prominent gastic folds of questionable importance (biopsied). Otherwise normal EGD   TOTAL ABDOMINAL HYSTERECTOMY W/ BILATERAL SALPINGOOPHORECTOMY      Family History  Problem Relation Age of Onset   Heart attack Mother    Prostate cancer Father    Diabetes Father    Colon cancer Neg Hx    Esophageal cancer Neg Hx    Pancreatic cancer Neg Hx    Stomach cancer Neg Hx    Liver disease Neg Hx     Social History   Tobacco Use   Smoking status: Former    Types: Cigarettes   Smokeless tobacco: Never  Vaping Use   Vaping Use: Never used  Substance Use Topics   Alcohol use: Never   Drug use: Never    Current Outpatient Medications  Medication Sig Dispense Refill   aspirin 81 MG chewable tablet Chew by mouth daily.     atorvastatin (LIPITOR) 10 MG tablet Take by mouth. Take 1 tablet (Oral) daily     Cholecalciferol (VITAMIN D3)  50 MCG (2000 UT) TABS Take by mouth. Take 1 tablet daily     Cyanocobalamin (VITAMIN B12) 500 MCG TABS Take by mouth. Take 1 tablet daily     donepezil (ARICEPT) 10 MG tablet Take 1 tablet by mouth daily.     hydrALAZINE (APRESOLINE) 50 MG tablet Take by mouth. Take 1 tablet(oral) two times daily     levothyroxine (SYNTHROID) 25 MCG tablet Take by mouth. Take 1 tablet by mouth every morning     Melatonin 5 MG CHEW Chew by mouth. Take 1 tablet daily     memantine (NAMENDA) 10 MG tablet Take by mouth.  Take 1 tablet by mouth two times a day     metFORMIN (GLUCOPHAGE) 1000 MG tablet Take by mouth. Take 1 Tablet (Oral) two times daily     metoprolol succinate (TOPROL-XL) 100 MG 24 hr tablet Take 1 tablet by mouth daily     Multiple Vitamin (MULTIVITAMIN) tablet Take 1 tablet by mouth daily.     omeprazole (PRILOSEC) 20 MG capsule Take 1 capsule by mouth daily.     sertraline (ZOLOFT) 50 MG tablet Take 1 tablet daily     vitamin C (ASCORBIC ACID) 500 MG tablet Take 500 mg by mouth daily.     No current facility-administered medications for this visit.    Not on File  Review of Systems:  Constitutional: Denies fever, chills, diaphoresis, appetite change and fatigue.  HEENT: Denies photophobia, eye pain, redness, hearing loss, ear pain, congestion, sore throat, rhinorrhea, sneezing, mouth sores, neck pain, neck stiffness and tinnitus.   Respiratory: Denies SOB, DOE, cough, chest tightness,  and wheezing.   Cardiovascular: Denies chest pain, palpitations and leg swelling.  Genitourinary: Denies dysuria, urgency, frequency, hematuria, flank pain and difficulty urinating.  Musculoskeletal: Denies myalgias, back pain, joint swelling, arthralgias and gait problem.  Skin: No rash.  Neurological: Denies dizziness, seizures, syncope, weakness, light-headedness, numbness and headaches.  Hematological: Denies adenopathy. Easy bruising, personal or family bleeding history  Psychiatric/Behavioral: No anxiety or depression     Physical Exam:    BP (!) 156/78   Pulse 64   Ht 5\' 5"  (1.651 m)   Wt 144 lb 4 oz (65.4 kg)   SpO2 98%   BMI 24.00 kg/m  Wt Readings from Last 3 Encounters:  10/22/20 144 lb 4 oz (65.4 kg)   Constitutional: Thin built female.  In no acute distress. Psychiatric: Normal mood and affect. Behavior is normal. HEENT: Pupils normal.  Conjunctivae are normal. No scleral icterus. Neck supple.  Cardiovascular: Normal rate, regular rhythm. No edema Pulmonary/chest: Effort  normal and breath sounds normal. No wheezing, rales or rhonchi. Abdominal: Soft, nondistended. Nontender. Bowel sounds active throughout. There are no masses palpable. No hepatomegaly. Rectal: Deferred Neurological: Dementia Skin: Skin is warm and dry. No rashes noted.  Data Reviewed: I have personally reviewed following labs and imaging studies  CT Abdo/pelvis as above was reviewed.    10/24/20, MD 10/22/2020, 3:48 PM  Cc: 10/24/2020, FNP

## 2020-10-23 DIAGNOSIS — F22 Delusional disorders: Secondary | ICD-10-CM | POA: Diagnosis not present

## 2020-10-23 DIAGNOSIS — R296 Repeated falls: Secondary | ICD-10-CM | POA: Diagnosis not present

## 2020-10-23 DIAGNOSIS — R404 Transient alteration of awareness: Secondary | ICD-10-CM | POA: Diagnosis not present

## 2020-10-23 DIAGNOSIS — F0151 Vascular dementia with behavioral disturbance: Secondary | ICD-10-CM | POA: Diagnosis not present

## 2020-10-23 DIAGNOSIS — R451 Restlessness and agitation: Secondary | ICD-10-CM | POA: Diagnosis not present

## 2020-11-14 DIAGNOSIS — R4182 Altered mental status, unspecified: Secondary | ICD-10-CM | POA: Diagnosis not present

## 2020-11-14 DIAGNOSIS — R451 Restlessness and agitation: Secondary | ICD-10-CM | POA: Diagnosis not present

## 2020-11-14 DIAGNOSIS — R404 Transient alteration of awareness: Secondary | ICD-10-CM | POA: Diagnosis not present

## 2020-11-14 DIAGNOSIS — F22 Delusional disorders: Secondary | ICD-10-CM | POA: Diagnosis not present

## 2020-11-14 DIAGNOSIS — R296 Repeated falls: Secondary | ICD-10-CM | POA: Diagnosis not present

## 2020-11-14 DIAGNOSIS — G319 Degenerative disease of nervous system, unspecified: Secondary | ICD-10-CM | POA: Diagnosis not present

## 2020-11-14 DIAGNOSIS — R9089 Other abnormal findings on diagnostic imaging of central nervous system: Secondary | ICD-10-CM | POA: Diagnosis not present

## 2020-11-21 DIAGNOSIS — I6930 Unspecified sequelae of cerebral infarction: Secondary | ICD-10-CM | POA: Diagnosis not present

## 2020-11-21 DIAGNOSIS — I129 Hypertensive chronic kidney disease with stage 1 through stage 4 chronic kidney disease, or unspecified chronic kidney disease: Secondary | ICD-10-CM | POA: Diagnosis not present

## 2020-11-21 DIAGNOSIS — E782 Mixed hyperlipidemia: Secondary | ICD-10-CM | POA: Diagnosis not present

## 2020-11-21 DIAGNOSIS — F22 Delusional disorders: Secondary | ICD-10-CM | POA: Diagnosis not present

## 2020-11-21 DIAGNOSIS — N1831 Chronic kidney disease, stage 3a: Secondary | ICD-10-CM | POA: Diagnosis not present

## 2020-11-21 DIAGNOSIS — E1122 Type 2 diabetes mellitus with diabetic chronic kidney disease: Secondary | ICD-10-CM | POA: Diagnosis not present

## 2020-11-21 DIAGNOSIS — K219 Gastro-esophageal reflux disease without esophagitis: Secondary | ICD-10-CM | POA: Diagnosis not present

## 2020-11-21 DIAGNOSIS — F0151 Vascular dementia with behavioral disturbance: Secondary | ICD-10-CM | POA: Diagnosis not present

## 2020-11-21 DIAGNOSIS — E034 Atrophy of thyroid (acquired): Secondary | ICD-10-CM | POA: Diagnosis not present

## 2020-12-02 ENCOUNTER — Emergency Department: Payer: Medicare HMO

## 2020-12-02 ENCOUNTER — Other Ambulatory Visit: Payer: Self-pay

## 2020-12-02 ENCOUNTER — Emergency Department
Admission: EM | Admit: 2020-12-02 | Discharge: 2020-12-02 | Disposition: A | Discharge status: Home / Self Care | Payer: Medicare HMO | Attending: Emergency Medicine | Admitting: Emergency Medicine

## 2020-12-02 DIAGNOSIS — W19XXXA Unspecified fall, initial encounter: Secondary | ICD-10-CM

## 2020-12-02 DIAGNOSIS — R404 Transient alteration of awareness: Secondary | ICD-10-CM | POA: Diagnosis not present

## 2020-12-02 DIAGNOSIS — Z79899 Other long term (current) drug therapy: Secondary | ICD-10-CM | POA: Insufficient documentation

## 2020-12-02 DIAGNOSIS — S0101XA Laceration without foreign body of scalp, initial encounter: Secondary | ICD-10-CM

## 2020-12-02 DIAGNOSIS — S0003XA Contusion of scalp, initial encounter: Secondary | ICD-10-CM

## 2020-12-02 DIAGNOSIS — Y93G1 Activity, food preparation and clean up: Secondary | ICD-10-CM | POA: Insufficient documentation

## 2020-12-02 DIAGNOSIS — M50323 Other cervical disc degeneration at C6-C7 level: Secondary | ICD-10-CM | POA: Diagnosis not present

## 2020-12-02 DIAGNOSIS — Z87891 Personal history of nicotine dependence: Secondary | ICD-10-CM | POA: Diagnosis not present

## 2020-12-02 DIAGNOSIS — Z043 Encounter for examination and observation following other accident: Secondary | ICD-10-CM | POA: Diagnosis not present

## 2020-12-02 DIAGNOSIS — Z7982 Long term (current) use of aspirin: Secondary | ICD-10-CM | POA: Insufficient documentation

## 2020-12-02 DIAGNOSIS — E119 Type 2 diabetes mellitus without complications: Secondary | ICD-10-CM | POA: Insufficient documentation

## 2020-12-02 DIAGNOSIS — I10 Essential (primary) hypertension: Secondary | ICD-10-CM | POA: Insufficient documentation

## 2020-12-02 DIAGNOSIS — Z7984 Long term (current) use of oral hypoglycemic drugs: Secondary | ICD-10-CM | POA: Insufficient documentation

## 2020-12-02 DIAGNOSIS — W01198A Fall on same level from slipping, tripping and stumbling with subsequent striking against other object, initial encounter: Secondary | ICD-10-CM | POA: Diagnosis not present

## 2020-12-02 DIAGNOSIS — M50321 Other cervical disc degeneration at C4-C5 level: Secondary | ICD-10-CM | POA: Diagnosis not present

## 2020-12-02 DIAGNOSIS — F0390 Unspecified dementia without behavioral disturbance: Secondary | ICD-10-CM | POA: Diagnosis not present

## 2020-12-02 DIAGNOSIS — F32A Depression, unspecified: Secondary | ICD-10-CM | POA: Diagnosis not present

## 2020-12-02 DIAGNOSIS — M47812 Spondylosis without myelopathy or radiculopathy, cervical region: Secondary | ICD-10-CM | POA: Diagnosis not present

## 2020-12-02 DIAGNOSIS — S060X0A Concussion without loss of consciousness, initial encounter: Secondary | ICD-10-CM | POA: Insufficient documentation

## 2020-12-02 DIAGNOSIS — M5031 Other cervical disc degeneration,  high cervical region: Secondary | ICD-10-CM | POA: Insufficient documentation

## 2020-12-02 DIAGNOSIS — M50322 Other cervical disc degeneration at C5-C6 level: Secondary | ICD-10-CM | POA: Diagnosis not present

## 2020-12-02 MED ORDER — LIDOCAINE HCL (PF) 1 % IJ SOLN
INTRAMUSCULAR | Status: AC
  Filled 2020-12-02: qty 5

## 2020-12-02 NOTE — ED Triage Notes (Signed)
Pt arrived via EMS from home after a fall. Pt lives with daughter who is enroute. Pt fell back of her head. Hx dementia, HTN, DM. EDP at bedside

## 2020-12-02 NOTE — ED Provider Notes (Signed)
[facility] Emergency Department Provider Note   ____________________________________________   Event Date/Time   First MD Initiated Contact with Patient 12/02/20 2114     (approximate)  I have reviewed the triage vital signs and the nursing notes.   HISTORY  Chief Complaint Fall    HPI Haley Sullivan is a 80 y.o. female who presents via EMS after a mechanical fall from standing resulting in occipital scalp laceration  LOCATION: Scalp/head DURATION: Just prior to arrival TIMING: Stable since onset SEVERITY: Moderate QUALITY: Head trauma with laceration and contusion CONTEXT: Patient has history of dementia and is a poor historian however states that she lost her balance while trying to do dishes here this evening falling backwards and slamming her head onto a tile floor MODIFYING FACTORS: Palpation over this area worsens the pain and she denies any relieving factors ASSOCIATED SYMPTOMS: Denies   Per medical record review, patient has history of dementia          Past Medical History:  Diagnosis Date   Anxiety and depression    Dementia (HCC)    Diabetes (HCC)    GERD (gastroesophageal reflux disease)    History of GI diverticular bleed    Hypertension    Insomnia    Obesity     There are no problems to display for this patient.   Past Surgical History:  Procedure Laterality Date   APPENDECTOMY     COLONOSCOPY  01/10/2010   Pancolonic divericulosis. The patient likely had a diverticular bleed which currently has resolved. There was no active bleeding   ESOPHAGOGASTRODUODENOSCOPY  01/08/2010   Prominent gastic folds of questionable importance (biopsied). Otherwise normal EGD   TOTAL ABDOMINAL HYSTERECTOMY W/ BILATERAL SALPINGOOPHORECTOMY      Prior to Admission medications   Medication Sig Start Date End Date Taking? Authorizing Provider  aspirin 81 MG chewable tablet Chew by mouth daily.    [provider]   atorvastatin (LIPITOR) 10 MG tablet Take by mouth. Take 1 tablet (Oral) daily    [provider]  Cholecalciferol (VITAMIN D3) 50 MCG (2000 UT) TABS Take by mouth. Take 1 tablet daily    [provider]  Cyanocobalamin (VITAMIN B12) 500 MCG TABS Take by mouth. Take 1 tablet daily    [provider]  donepezil (ARICEPT) 10 MG tablet Take 1 tablet by mouth daily. 04/30/17   [provider]  hydrALAZINE (APRESOLINE) 50 MG tablet Take by mouth. Take 1 tablet(oral) two times daily 04/30/17   [provider]  levothyroxine (SYNTHROID) 25 MCG tablet Take by mouth. Take 1 tablet by mouth every morning 03/17/20   [provider]  Melatonin 5 MG CHEW Chew by mouth. Take 1 tablet daily    [provider]  memantine (NAMENDA) 10 MG tablet Take by mouth. Take 1 tablet by mouth two times a day 04/30/17   [provider]  metFORMIN (GLUCOPHAGE) 1000 MG tablet Take by mouth. Take 1 Tablet (Oral) two times daily 04/30/17   [provider]  metoprolol succinate (TOPROL-XL) 100 MG 24 hr tablet Take 1 tablet by mouth daily 08/10/20   [provider]  Multiple Vitamin (MULTIVITAMIN) tablet Take 1 tablet by mouth daily.    [provider]  omeprazole (PRILOSEC) 20 MG capsule Take 1 capsule by mouth daily. 04/30/17   [provider]  sertraline (ZOLOFT) 50 MG tablet Take 1 tablet daily 06/02/20   [provider]  vitamin C (ASCORBIC ACID) 500 MG  tablet Take 500 mg by mouth daily.    [provider]    Allergies Patient has no known allergies.  Family History  Problem Relation Age of Onset   Heart attack Mother    Prostate cancer Father    Diabetes Father    Colon cancer Neg Hx    Esophageal cancer Neg Hx    Pancreatic cancer Neg Hx    Stomach cancer Neg Hx    Liver disease Neg Hx     Social History Social History   Tobacco Use   Smoking status: Former    Types: Cigarettes   Smokeless  tobacco: Never  Vaping Use   Vaping Use: Never used  Substance Use Topics   Alcohol use: Never   Drug use: Never    Review of Systems Constitutional: No fever/chills Eyes: No visual changes. Head: Endorses occipital scalp laceration with associated pain ENT: No sore throat. Cardiovascular: Denies chest pain. Respiratory: Denies shortness of breath. Gastrointestinal: No abdominal pain.  No nausea, no vomiting.  No diarrhea. Genitourinary: Negative for dysuria. Musculoskeletal: Negative for acute arthralgias Skin: Occipital scalp laceration.  Negative for rash. Neurological: Negative for headaches, weakness/numbness/paresthesias in any extremity Psychiatric: Negative for suicidal ideation/homicidal ideation   ____________________________________________   PHYSICAL EXAM:  VITAL SIGNS: ED Triage Vitals  Enc Vitals Group     BP 12/02/20 2113 (!) 189/76     Pulse Rate 12/02/20 2113 60     Resp 12/02/20 2113 14     Temp 12/02/20 2113 98.2 F (36.8 C)     Temp Source 12/02/20 2113 Oral     SpO2 12/02/20 2113 99 %     Weight 12/02/20 2116 160 lb (72.6 kg)     Height 12/02/20 2116 5\' 5"  (1.651 m)     Head Circumference --      Peak Flow --      Pain Score --      Pain Loc --      Pain Edu? --      Excl. in GC? --    Constitutional: Alert and oriented. Well appearing and in no acute distress. Eyes: Conjunctivae are normal. PERRL. Head: Occipital scalp contusion Nose: No congestion/rhinnorhea. Mouth/Throat: Mucous membranes are moist. Neck: No stridor Cardiovascular: Grossly normal heart sounds.  Good peripheral circulation. Respiratory: Normal respiratory effort.  No retractions. Gastrointestinal: Soft and nontender. No distention. Musculoskeletal: No obvious deformities Neurologic:  Normal speech and language. No gross focal neurologic deficits are appreciated. Skin:  Skin is warm and dry. No rash noted.  4 cm stellate laceration to the occipital scalp with surrounding  contusion Psychiatric: Mood and affect are normal. Speech and behavior are normal.  ____________________________________________   LABS (all labs ordered are listed, but only abnormal results are displayed)  Labs Reviewed - No data to display  RADIOLOGY  ED MD interpretation: CT of the head without contrast shows no evidence of acute abnormalities including no intracerebral hemorrhage, obvious masses, or significant edema  CT of the cervical spine does not show any evidence of acute abnormalities including no acute fracture, malalignment, height loss, or dislocation  Official radiology report(s): CT Head Wo Contrast  Result Date: 12/02/2020 CLINICAL DATA:  Status post fall. EXAM: CT HEAD WITHOUT CONTRAST TECHNIQUE: Contiguous axial images were obtained from the base of the skull through the vertex without intravenous contrast. COMPARISON:  None. FINDINGS: Brain: There is mild cerebral atrophy with widening of the extra-axial spaces and ventricular dilatation. There are areas of decreased attenuation within  the white matter tracts of the supratentorial brain, consistent with microvascular disease changes. 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 Vascular: No hyperdense vessel or unexpected calcification. Skull: Normal. Negative for fracture or focal lesion. Sinuses/Orbits: No acute finding. Other: None. IMPRESSION: 1. Generalized cerebral atrophy. 2. Chronic left cerebellar infarct. 3. No acute intracranial abnormality. Electronically Signed   By: Aram Candela M.D.   On: 12/02/2020 21:39   CT Cervical Spine Wo Contrast  Result Date: 12/02/2020 CLINICAL DATA:  Status post fall. EXAM: CT CERVICAL SPINE WITHOUT CONTRAST TECHNIQUE: Multidetector CT imaging of the cervical spine was performed without intravenous contrast. Multiplanar CT image reconstructions were also generated. COMPARISON:  None. FINDINGS: Alignment: Normal. Skull base and vertebrae: No acute fracture. No primary bone lesion  or focal pathologic process. Soft tissues and spinal canal: No prevertebral fluid or swelling. No visible canal hematoma. Disc levels: Moderate to marked severity endplate sclerosis is seen at the levels of C2-C3, C3-C4, C4-C5, C5-C6 and C6-C7. Moderate to marked severity intervertebral disc space narrowing is also seen at the levels of C2-C3, C3-C4, C4-C5, C5-C6 and C6-C7. Bilateral mild-to-moderate severity multilevel facet joint hypertrophy is noted. This is most prominent at the level C2-C3. Upper chest: Negative. Other: None. IMPRESSION: 1. Moderate to marked severity multilevel degenerative changes, most prominent at the levels of C2-C3, C3-C4, C4-C5, C5-C6 and C6-C7. 2. No evidence of an acute fracture or subluxation. Electronically Signed   By: Aram Candela M.D.   On: 12/02/2020 21:41    ____________________________________________   PROCEDURES  Procedure(s) performed (including Critical Care):  .1-3 Lead EKG Interpretation Performed by: Merwyn Katos, MD Authorized by: Merwyn Katos, MD     Interpretation: normal     ECG rate:  59   ECG rate assessment: normal     Rhythm: sinus rhythm     Ectopy: none     Conduction: normal   ..Laceration Repair  Date/Time: 12/02/2020 10:59 PM Performed by: Merwyn Katos, MD Authorized by: Merwyn Katos, MD   Consent:    Consent obtained:  Verbal   Consent given by:  Patient and guardian   Risks, benefits, and alternatives were discussed: yes     Risks discussed:  Infection and poor wound healing   Alternatives discussed:  No treatment, delayed treatment and observation Universal protocol:    Immediately prior to procedure, a time out was called: yes     Patient identity confirmed:  Verbally with patient and arm band Anesthesia:    Anesthesia method:  Local infiltration   Local anesthetic:  Lidocaine 1% w/o epi Laceration details:    Location:  Scalp   Scalp location:  Occipital   Length (cm):  4   Depth (mm):   7 Pre-procedure details:    Preparation:  Patient was prepped and draped in usual sterile fashion and imaging obtained to evaluate for foreign bodies Exploration:    Limited defect created (wound extended): no   Treatment:    Area cleansed with:  Povidone-iodine   Amount of cleaning:  Standard   Irrigation solution:  Sterile saline   Irrigation volume:  400   Irrigation method:  Pressure wash   Visualized foreign bodies/material removed: no     Debridement:  None   Undermining:  None   Scar revision: no   Skin repair:    Repair method:  Staples   Number of staples:  6 Approximation:    Approximation:  Close Repair type:    Repair type:  Simple Post-procedure details:    Dressing:  Sterile dressing   Procedure completion:  Tolerated well, no immediate complications   ____________________________________________   INITIAL IMPRESSION / ASSESSMENT AND PLAN / ED COURSE  As part of my medical decision making, I reviewed the following data within the electronic medical record, if available:  Nursing notes reviewed and incorporated, Labs reviewed, EKG interpreted, Old chart reviewed, Radiograph reviewed and Notes from prior ED visits reviewed and incorporated        Patient presenting with head trauma.  Patient's neurological exam was non-focal and unremarkable.  [place] Head CT Rule was applied and patient did not fall into the low risk category so a head CT was obtained.  This showed no significant findings.  At this time, it is felt that the most likely explanation for the patient's symptoms is concussion.   I also considered SAH, SDH, Epidural Hematoma, IPH, skull fracture, migraine but this appears less likely considering the data gathered thus far.   Patient provided laceration repair.   Patient remained stable and neurologically intact while in the emergency department.  Discussed warning signs that would prompt return to ED.  Head trauma handout was provided.  Discussed in  detail concussion management.  No sports or strenuous activity until symptoms free.  Return to emergency department urgently if new or worsening symptoms develop.    Impression:  Concussion Occipital scalp laceration with contusion  Plan  Discharge from ED Tylenol for pain control. Avoid aspirin, NSAIDs, or other blood thinners. Advised patient on supportive measures for cognitive rest - avoid use of cognitive function for at least 24 hours.  This means no tv, books, texting, computers, etc. Limit visitors to the house.  Head trauma instructions provided in discharge instructions Instructed Pt to monitor for neurologic symptoms, severe HA, change in mental status, seizures, loss of conciousness. Instructed Pt to f/up w/ PCP in 5-7 days or ETC should symptoms worsen or not improve. Pt verbally expressed understanding and all questions were addressed to Pt's satisfaction.      ____________________________________________   FINAL CLINICAL IMPRESSION(S) / ED DIAGNOSES  Final diagnoses:  Fall, initial encounter  Laceration of scalp, initial encounter  Contusion of scalp, initial encounter     ED Discharge Orders     None        Note:  This document was prepared using Dragon voice recognition software and may include unintentional dictation errors.    Merwyn Katos, MD 12/02/20 2300

## 2020-12-02 NOTE — ED Notes (Signed)
Daughter at bedside. Confirms that pt fell back and hit her head on the floor

## 2020-12-04 DIAGNOSIS — S0101XA Laceration without foreign body of scalp, initial encounter: Secondary | ICD-10-CM | POA: Diagnosis not present

## 2020-12-04 DIAGNOSIS — S0003XA Contusion of scalp, initial encounter: Secondary | ICD-10-CM | POA: Diagnosis not present

## 2020-12-04 DIAGNOSIS — Z6823 Body mass index (BMI) 23.0-23.9, adult: Secondary | ICD-10-CM | POA: Diagnosis not present

## 2020-12-09 ENCOUNTER — Encounter: Payer: Self-pay | Admitting: Physician Assistant

## 2020-12-09 ENCOUNTER — Other Ambulatory Visit: Payer: Self-pay

## 2020-12-09 ENCOUNTER — Emergency Department
Admission: EM | Admit: 2020-12-09 | Discharge: 2020-12-09 | Disposition: A | Discharge status: Home / Self Care | Payer: Medicare HMO | Attending: Student in an Organized Health Care Education/Training Program | Admitting: Student in an Organized Health Care Education/Training Program

## 2020-12-09 DIAGNOSIS — E119 Type 2 diabetes mellitus without complications: Secondary | ICD-10-CM | POA: Insufficient documentation

## 2020-12-09 DIAGNOSIS — Z79899 Other long term (current) drug therapy: Secondary | ICD-10-CM | POA: Diagnosis not present

## 2020-12-09 DIAGNOSIS — I10 Essential (primary) hypertension: Secondary | ICD-10-CM | POA: Insufficient documentation

## 2020-12-09 DIAGNOSIS — Z4802 Encounter for removal of sutures: Secondary | ICD-10-CM | POA: Insufficient documentation

## 2020-12-09 DIAGNOSIS — S0101XD Laceration without foreign body of scalp, subsequent encounter: Secondary | ICD-10-CM | POA: Diagnosis not present

## 2020-12-09 DIAGNOSIS — W01198D Fall on same level from slipping, tripping and stumbling with subsequent striking against other object, subsequent encounter: Secondary | ICD-10-CM | POA: Diagnosis not present

## 2020-12-09 DIAGNOSIS — Z7982 Long term (current) use of aspirin: Secondary | ICD-10-CM | POA: Diagnosis not present

## 2020-12-09 DIAGNOSIS — R42 Dizziness and giddiness: Secondary | ICD-10-CM | POA: Diagnosis not present

## 2020-12-09 DIAGNOSIS — Z87891 Personal history of nicotine dependence: Secondary | ICD-10-CM | POA: Diagnosis not present

## 2020-12-09 DIAGNOSIS — R112 Nausea with vomiting, unspecified: Secondary | ICD-10-CM | POA: Insufficient documentation

## 2020-12-09 DIAGNOSIS — Z7984 Long term (current) use of oral hypoglycemic drugs: Secondary | ICD-10-CM | POA: Diagnosis not present

## 2020-12-09 DIAGNOSIS — F0390 Unspecified dementia without behavioral disturbance: Secondary | ICD-10-CM | POA: Diagnosis not present

## 2020-12-09 NOTE — ED Triage Notes (Addendum)
Pt fell and hit her head a week ago, pt is here to have her staples removed but daughter states that she was nauseated this am and has vomited and complaining of feeling dizzy. Daughter is asking if it is too far out for her to have a concussion. Pt didn't take her bp meds this am due to vomiting

## 2020-12-09 NOTE — ED Provider Notes (Signed)
[facility] Emergency Department Provider Note ____________________________________________  Time seen: 1130  I have reviewed the triage vital signs and the nursing notes.  HISTORY  Chief Complaint  Suture / Staple Removal, Nausea, Emesis, and Dizziness   HPI Haley Sullivan is a 80 y.o. female presents with her adult daughter with request for staple removal.  Patient had staples placed about a week ago to the posterior scalp after mechanical fall.  Patient did have complaints earlier today of some nausea and vomiting, but symptoms have resolved after the patient had something to eat.  The daughter was wondering if the symptoms may be likely related to a low blood sugar.  She did not check the patient's blood sugar at the time.  Patient is without complaint and patient's adult daughter is without any further concern.  Past Medical History:  Diagnosis Date   Anxiety and depression    Dementia (HCC)    Diabetes (HCC)    GERD (gastroesophageal reflux disease)    History of GI diverticular bleed    Hypertension    Insomnia    Obesity     There are no problems to display for this patient.   Past Surgical History:  Procedure Laterality Date   APPENDECTOMY     COLONOSCOPY  01/10/2010   Pancolonic divericulosis. The patient likely had a diverticular bleed which currently has resolved. There was no active bleeding   ESOPHAGOGASTRODUODENOSCOPY  01/08/2010   Prominent gastic folds of questionable importance (biopsied). Otherwise normal EGD   TOTAL ABDOMINAL HYSTERECTOMY W/ BILATERAL SALPINGOOPHORECTOMY      Prior to Admission medications   Medication Sig Start Date End Date Taking? Authorizing Provider  aspirin 81 MG chewable tablet Chew by mouth daily.    [provider]  atorvastatin (LIPITOR) 10 MG tablet Take by mouth. Take 1 tablet (Oral) daily    [provider]  Cholecalciferol (VITAMIN D3) 50 MCG (2000 UT) TABS Take by mouth. Take  1 tablet daily    [provider]  Cyanocobalamin (VITAMIN B12) 500 MCG TABS Take by mouth. Take 1 tablet daily    [provider]  donepezil (ARICEPT) 10 MG tablet Take 1 tablet by mouth daily. 04/30/17   [provider]  hydrALAZINE (APRESOLINE) 50 MG tablet Take by mouth. Take 1 tablet(oral) two times daily 04/30/17   [provider]  levothyroxine (SYNTHROID) 25 MCG tablet Take by mouth. Take 1 tablet by mouth every morning 03/17/20   [provider]  Melatonin 5 MG CHEW Chew by mouth. Take 1 tablet daily    [provider]  memantine (NAMENDA) 10 MG tablet Take by mouth. Take 1 tablet by mouth two times a day 04/30/17   [provider]  metFORMIN (GLUCOPHAGE) 1000 MG tablet Take by mouth. Take 1 Tablet (Oral) two times daily 04/30/17   [provider]  metoprolol succinate (TOPROL-XL) 100 MG 24 hr tablet Take 1 tablet by mouth daily 08/10/20   [provider]  Multiple Vitamin (MULTIVITAMIN) tablet Take 1 tablet by mouth daily.    [provider]  omeprazole (PRILOSEC) 20 MG capsule Take 1 capsule by mouth daily. 04/30/17   [provider]  sertraline (ZOLOFT) 50 MG tablet Take 1 tablet daily 06/02/20   [provider]  vitamin C (ASCORBIC ACID) 500 MG tablet Take 500 mg by mouth daily.    [provider]    Allergies Penicillins  Family History  Problem Relation Age of Onset  Heart attack Mother    Prostate cancer Father    Diabetes Father    Colon cancer Neg Hx    Esophageal cancer Neg Hx    Pancreatic cancer Neg Hx    Stomach cancer Neg Hx    Liver disease Neg Hx     Social History Social History   Tobacco Use   Smoking status: Former    Types: Cigarettes   Smokeless tobacco: Never  Vaping Use   Vaping Use: Never used  Substance Use Topics   Alcohol use: Never   Drug use: Never    Review of Systems  Constitutional: Negative for fever. Eyes: Negative for visual  changes. ENT: Negative for sore throat. Cardiovascular: Negative for chest pain. Respiratory: Negative for shortness of breath. Gastrointestinal: Negative for abdominal pain, vomiting and diarrhea. Genitourinary: Negative for dysuria. Musculoskeletal: Negative for back pain. Skin: Negative for rash. Staple removal from scalp.  Neurological: Negative for headaches, focal weakness or numbness. ____________________________________________  PHYSICAL EXAM:  VITAL SIGNS: ED Triage Vitals  Enc Vitals Group     BP 12/09/20 1124 (!) 204/63     Pulse Rate 12/09/20 1124 (!) 53     Resp 12/09/20 1124 16     Temp 12/09/20 1124 97.7 F (36.5 C)     Temp Source 12/09/20 1124 Oral     SpO2 12/09/20 1124 98 %     Weight 12/09/20 1125 130 lb (59 kg)     Height 12/09/20 1125 5\' 5"  (1.651 m)     Head Circumference --      Peak Flow --      Pain Score 12/09/20 1125 0     Pain Loc --      Pain Edu? --      Excl. in GC? --     Constitutional: Alert and oriented. Well appearing and in no distress. Head: Normocephalic and atraumatic, sent for posterior scalp wound with 7 staples in place and good wound healing.. Eyes: Conjunctivae are normal. PERRL. Normal extraocular movements Ears: Canals clear. TMs intact bilaterally. Neck: Supple. No thyromegaly. Cardiovascular: Normal rate, regular rhythm. Normal distal pulses. Respiratory: Normal respiratory effort. No wheezes/rales/rhonchi. Gastrointestinal: Soft and nontender. No distention. Musculoskeletal: Nontender with normal range of motion in all extremities.  Neurologic: Normal speech and language. No gross focal neurologic deficits are appreciated. Skin:  Skin is warm, dry and intact. No rash noted. Psychiatric: Mood and affect are normal. Patient exhibits appropriate insight and judgment. ____________________________________________    {LABS (pertinent  positives/negatives)  ____________________________________________  {EKG  ____________________________________________   RADIOLOGY Official radiology report(s): No results found. ____________________________________________  PROCEDURES   .Suture Removal  Date/Time: 12/09/2020 11:28 AM Performed by: 02/08/2021, PA-C Authorized by: Lissa Hoard, PA-C   Consent:    Consent obtained:  Verbal   Consent given by:  Guardian   Risks, benefits, and alternatives were discussed: yes     Risks discussed:  Bleeding and wound separation Universal protocol:    Procedure explained and questions answered to patient or proxy's satisfaction: yes     Site/side marked: yes     Patient identity confirmed:  Verbally with patient Location:    Location:  Head/neck   Head/neck location:  Scalp Procedure details:    Wound appearance:  No signs of infection and good wound healing   Number of staples removed:  6 Post-procedure details:    Post-removal:  No dressing applied   Procedure completion:  Tolerated well, no immediate complications  ____________________________________________   INITIAL IMPRESSION / ASSESSMENT AND PLAN / ED COURSE  As part of my medical decision making, I reviewed the following data within the electronic MEDICAL RECORD NUMBER History obtained from family and Notes from prior ED visits   Geriatric patient ED evaluation and request for staple removal.  Patient had a posterior scalp wound repair last week.  She presents today without significant complaint.  She did have some early complaints of some nausea vomiting, which have now resolved completely.  Patient nor her daughter are not voiced any concern at this time for further work-up.  Haley Sullivan was evaluated in Emergency Department on 12/09/2020 for the symptoms described in the history of present illness. She was evaluated in the context of the global COVID-19 pandemic, which necessitated  consideration that the patient might be at risk for infection with the SARS-CoV-2 virus that causes COVID-19. Institutional protocols and algorithms that pertain to the evaluation of patients at risk for COVID-19 are in a state of rapid change based on information released by regulatory bodies including the CDC and federal and state organizations. These policies and algorithms were followed during the patient's care in the ED. ____________________________________________  FINAL CLINICAL IMPRESSION(S) / ED DIAGNOSES  Final diagnoses:  Encounter for staple removal      Karmen Stabs, Charlesetta Ivory, PA-C 12/09/20 1929    Willy Eddy, MD 12/10/20 2027

## 2020-12-11 DIAGNOSIS — R634 Abnormal weight loss: Secondary | ICD-10-CM | POA: Diagnosis not present

## 2020-12-11 DIAGNOSIS — F01518 Vascular dementia, unspecified severity, with other behavioral disturbance: Secondary | ICD-10-CM | POA: Diagnosis not present

## 2020-12-11 DIAGNOSIS — F22 Delusional disorders: Secondary | ICD-10-CM | POA: Diagnosis not present

## 2020-12-11 DIAGNOSIS — G319 Degenerative disease of nervous system, unspecified: Secondary | ICD-10-CM | POA: Diagnosis not present

## 2020-12-11 DIAGNOSIS — R443 Hallucinations, unspecified: Secondary | ICD-10-CM | POA: Diagnosis not present

## 2020-12-11 DIAGNOSIS — F05 Delirium due to known physiological condition: Secondary | ICD-10-CM | POA: Diagnosis not present

## 2020-12-11 DIAGNOSIS — R269 Unspecified abnormalities of gait and mobility: Secondary | ICD-10-CM | POA: Diagnosis not present

## 2020-12-11 DIAGNOSIS — R4189 Other symptoms and signs involving cognitive functions and awareness: Secondary | ICD-10-CM | POA: Diagnosis not present

## 2020-12-11 DIAGNOSIS — R451 Restlessness and agitation: Secondary | ICD-10-CM | POA: Diagnosis not present

## 2020-12-11 DIAGNOSIS — R2681 Unsteadiness on feet: Secondary | ICD-10-CM | POA: Diagnosis not present

## 2020-12-11 DIAGNOSIS — I6782 Cerebral ischemia: Secondary | ICD-10-CM | POA: Diagnosis not present

## 2021-01-04 DIAGNOSIS — R059 Cough, unspecified: Secondary | ICD-10-CM | POA: Diagnosis not present

## 2021-01-04 DIAGNOSIS — R058 Other specified cough: Secondary | ICD-10-CM | POA: Diagnosis not present

## 2021-01-22 DIAGNOSIS — F01518 Vascular dementia, unspecified severity, with other behavioral disturbance: Secondary | ICD-10-CM | POA: Diagnosis not present

## 2021-01-22 DIAGNOSIS — F22 Delusional disorders: Secondary | ICD-10-CM | POA: Diagnosis not present

## 2021-01-22 DIAGNOSIS — R451 Restlessness and agitation: Secondary | ICD-10-CM | POA: Diagnosis not present

## 2021-03-12 DIAGNOSIS — R350 Frequency of micturition: Secondary | ICD-10-CM | POA: Diagnosis not present

## 2021-03-12 DIAGNOSIS — Z6823 Body mass index (BMI) 23.0-23.9, adult: Secondary | ICD-10-CM | POA: Diagnosis not present

## 2021-03-28 DIAGNOSIS — F01518 Vascular dementia, unspecified severity, with other behavioral disturbance: Secondary | ICD-10-CM | POA: Diagnosis not present

## 2021-03-28 DIAGNOSIS — G4700 Insomnia, unspecified: Secondary | ICD-10-CM | POA: Diagnosis not present

## 2021-03-28 DIAGNOSIS — R451 Restlessness and agitation: Secondary | ICD-10-CM | POA: Diagnosis not present

## 2021-03-28 DIAGNOSIS — F22 Delusional disorders: Secondary | ICD-10-CM | POA: Diagnosis not present

## 2021-04-22 DIAGNOSIS — K219 Gastro-esophageal reflux disease without esophagitis: Secondary | ICD-10-CM | POA: Diagnosis not present

## 2021-04-22 DIAGNOSIS — E034 Atrophy of thyroid (acquired): Secondary | ICD-10-CM | POA: Diagnosis not present

## 2021-04-22 DIAGNOSIS — F01518 Vascular dementia, unspecified severity, with other behavioral disturbance: Secondary | ICD-10-CM | POA: Diagnosis not present

## 2021-04-22 DIAGNOSIS — E1122 Type 2 diabetes mellitus with diabetic chronic kidney disease: Secondary | ICD-10-CM | POA: Diagnosis not present

## 2021-04-22 DIAGNOSIS — N1831 Chronic kidney disease, stage 3a: Secondary | ICD-10-CM | POA: Diagnosis not present

## 2021-04-22 DIAGNOSIS — E782 Mixed hyperlipidemia: Secondary | ICD-10-CM | POA: Diagnosis not present

## 2021-04-22 DIAGNOSIS — F22 Delusional disorders: Secondary | ICD-10-CM | POA: Diagnosis not present

## 2021-04-22 DIAGNOSIS — I129 Hypertensive chronic kidney disease with stage 1 through stage 4 chronic kidney disease, or unspecified chronic kidney disease: Secondary | ICD-10-CM | POA: Diagnosis not present

## 2021-04-22 DIAGNOSIS — I6930 Unspecified sequelae of cerebral infarction: Secondary | ICD-10-CM | POA: Diagnosis not present

## 2021-04-23 DIAGNOSIS — E785 Hyperlipidemia, unspecified: Secondary | ICD-10-CM | POA: Diagnosis not present

## 2021-04-23 DIAGNOSIS — E039 Hypothyroidism, unspecified: Secondary | ICD-10-CM | POA: Diagnosis not present

## 2021-04-23 DIAGNOSIS — E1159 Type 2 diabetes mellitus with other circulatory complications: Secondary | ICD-10-CM | POA: Diagnosis not present

## 2021-04-23 DIAGNOSIS — Z8673 Personal history of transient ischemic attack (TIA), and cerebral infarction without residual deficits: Secondary | ICD-10-CM | POA: Diagnosis not present

## 2021-04-23 DIAGNOSIS — E876 Hypokalemia: Secondary | ICD-10-CM | POA: Diagnosis not present

## 2021-04-23 DIAGNOSIS — R197 Diarrhea, unspecified: Secondary | ICD-10-CM | POA: Diagnosis not present

## 2021-04-23 DIAGNOSIS — K219 Gastro-esophageal reflux disease without esophagitis: Secondary | ICD-10-CM | POA: Diagnosis not present

## 2021-04-23 DIAGNOSIS — R0602 Shortness of breath: Secondary | ICD-10-CM | POA: Diagnosis not present

## 2021-04-23 DIAGNOSIS — Z86718 Personal history of other venous thrombosis and embolism: Secondary | ICD-10-CM | POA: Diagnosis not present

## 2021-04-23 DIAGNOSIS — I152 Hypertension secondary to endocrine disorders: Secondary | ICD-10-CM | POA: Diagnosis not present

## 2021-04-24 DIAGNOSIS — E876 Hypokalemia: Secondary | ICD-10-CM | POA: Diagnosis not present

## 2021-05-05 DIAGNOSIS — R0602 Shortness of breath: Secondary | ICD-10-CM | POA: Diagnosis not present

## 2021-05-05 DIAGNOSIS — I447 Left bundle-branch block, unspecified: Secondary | ICD-10-CM | POA: Diagnosis not present

## 2021-05-05 DIAGNOSIS — I10 Essential (primary) hypertension: Secondary | ICD-10-CM | POA: Diagnosis not present

## 2021-05-09 DIAGNOSIS — R0602 Shortness of breath: Secondary | ICD-10-CM | POA: Diagnosis not present

## 2021-05-14 DIAGNOSIS — I10 Essential (primary) hypertension: Secondary | ICD-10-CM | POA: Diagnosis not present

## 2021-05-14 DIAGNOSIS — R0602 Shortness of breath: Secondary | ICD-10-CM | POA: Diagnosis not present

## 2021-06-30 DIAGNOSIS — R0602 Shortness of breath: Secondary | ICD-10-CM | POA: Diagnosis not present

## 2021-06-30 DIAGNOSIS — R451 Restlessness and agitation: Secondary | ICD-10-CM | POA: Diagnosis not present

## 2021-06-30 DIAGNOSIS — I10 Essential (primary) hypertension: Secondary | ICD-10-CM | POA: Diagnosis not present

## 2021-07-02 DIAGNOSIS — G4720 Circadian rhythm sleep disorder, unspecified type: Secondary | ICD-10-CM | POA: Diagnosis not present

## 2021-07-02 DIAGNOSIS — F03911 Unspecified dementia, unspecified severity, with agitation: Secondary | ICD-10-CM | POA: Diagnosis not present

## 2021-07-02 DIAGNOSIS — F22 Delusional disorders: Secondary | ICD-10-CM | POA: Diagnosis not present

## 2021-07-02 DIAGNOSIS — F01518 Vascular dementia, unspecified severity, with other behavioral disturbance: Secondary | ICD-10-CM | POA: Diagnosis not present

## 2021-07-02 DIAGNOSIS — R443 Hallucinations, unspecified: Secondary | ICD-10-CM | POA: Diagnosis not present

## 2021-07-02 DIAGNOSIS — F0150 Vascular dementia without behavioral disturbance: Secondary | ICD-10-CM | POA: Diagnosis not present

## 2021-07-24 DIAGNOSIS — N1831 Chronic kidney disease, stage 3a: Secondary | ICD-10-CM | POA: Diagnosis not present

## 2021-07-24 DIAGNOSIS — F01518 Vascular dementia, unspecified severity, with other behavioral disturbance: Secondary | ICD-10-CM | POA: Diagnosis not present

## 2021-07-24 DIAGNOSIS — E1122 Type 2 diabetes mellitus with diabetic chronic kidney disease: Secondary | ICD-10-CM | POA: Diagnosis not present

## 2021-07-24 DIAGNOSIS — R300 Dysuria: Secondary | ICD-10-CM | POA: Diagnosis not present

## 2021-07-24 DIAGNOSIS — E782 Mixed hyperlipidemia: Secondary | ICD-10-CM | POA: Diagnosis not present

## 2021-07-24 DIAGNOSIS — K219 Gastro-esophageal reflux disease without esophagitis: Secondary | ICD-10-CM | POA: Diagnosis not present

## 2021-07-24 DIAGNOSIS — I700 Atherosclerosis of aorta: Secondary | ICD-10-CM | POA: Diagnosis not present

## 2021-07-24 DIAGNOSIS — I129 Hypertensive chronic kidney disease with stage 1 through stage 4 chronic kidney disease, or unspecified chronic kidney disease: Secondary | ICD-10-CM | POA: Diagnosis not present

## 2021-07-24 DIAGNOSIS — N3000 Acute cystitis without hematuria: Secondary | ICD-10-CM | POA: Diagnosis not present

## 2021-07-24 DIAGNOSIS — E034 Atrophy of thyroid (acquired): Secondary | ICD-10-CM | POA: Diagnosis not present

## 2021-10-24 DIAGNOSIS — E034 Atrophy of thyroid (acquired): Secondary | ICD-10-CM | POA: Diagnosis not present

## 2021-10-24 DIAGNOSIS — E1122 Type 2 diabetes mellitus with diabetic chronic kidney disease: Secondary | ICD-10-CM | POA: Diagnosis not present

## 2021-10-24 DIAGNOSIS — F01518 Vascular dementia, unspecified severity, with other behavioral disturbance: Secondary | ICD-10-CM | POA: Diagnosis not present

## 2021-10-24 DIAGNOSIS — I129 Hypertensive chronic kidney disease with stage 1 through stage 4 chronic kidney disease, or unspecified chronic kidney disease: Secondary | ICD-10-CM | POA: Diagnosis not present

## 2021-10-24 DIAGNOSIS — E782 Mixed hyperlipidemia: Secondary | ICD-10-CM | POA: Diagnosis not present

## 2021-10-24 DIAGNOSIS — I700 Atherosclerosis of aorta: Secondary | ICD-10-CM | POA: Diagnosis not present

## 2021-10-24 DIAGNOSIS — N1831 Chronic kidney disease, stage 3a: Secondary | ICD-10-CM | POA: Diagnosis not present

## 2021-10-24 DIAGNOSIS — I6930 Unspecified sequelae of cerebral infarction: Secondary | ICD-10-CM | POA: Diagnosis not present

## 2021-10-24 DIAGNOSIS — K219 Gastro-esophageal reflux disease without esophagitis: Secondary | ICD-10-CM | POA: Diagnosis not present

## 2021-11-24 DIAGNOSIS — R001 Bradycardia, unspecified: Secondary | ICD-10-CM | POA: Diagnosis not present

## 2021-11-24 DIAGNOSIS — Z23 Encounter for immunization: Secondary | ICD-10-CM | POA: Diagnosis not present

## 2021-11-24 DIAGNOSIS — I10 Essential (primary) hypertension: Secondary | ICD-10-CM | POA: Diagnosis not present

## 2022-01-08 DIAGNOSIS — F03911 Unspecified dementia, unspecified severity, with agitation: Secondary | ICD-10-CM | POA: Diagnosis not present

## 2022-01-08 DIAGNOSIS — F01518 Vascular dementia, unspecified severity, with other behavioral disturbance: Secondary | ICD-10-CM | POA: Diagnosis not present

## 2022-01-08 DIAGNOSIS — F22 Delusional disorders: Secondary | ICD-10-CM | POA: Diagnosis not present

## 2022-01-27 DIAGNOSIS — I129 Hypertensive chronic kidney disease with stage 1 through stage 4 chronic kidney disease, or unspecified chronic kidney disease: Secondary | ICD-10-CM | POA: Diagnosis not present

## 2022-01-27 DIAGNOSIS — N1831 Chronic kidney disease, stage 3a: Secondary | ICD-10-CM | POA: Diagnosis not present

## 2022-01-27 DIAGNOSIS — E034 Atrophy of thyroid (acquired): Secondary | ICD-10-CM | POA: Diagnosis not present

## 2022-01-27 DIAGNOSIS — K219 Gastro-esophageal reflux disease without esophagitis: Secondary | ICD-10-CM | POA: Diagnosis not present

## 2022-01-27 DIAGNOSIS — E1122 Type 2 diabetes mellitus with diabetic chronic kidney disease: Secondary | ICD-10-CM | POA: Diagnosis not present

## 2022-01-27 DIAGNOSIS — Z9181 History of falling: Secondary | ICD-10-CM | POA: Diagnosis not present

## 2022-01-27 DIAGNOSIS — E782 Mixed hyperlipidemia: Secondary | ICD-10-CM | POA: Diagnosis not present

## 2022-01-27 DIAGNOSIS — I700 Atherosclerosis of aorta: Secondary | ICD-10-CM | POA: Diagnosis not present

## 2022-01-27 DIAGNOSIS — Z1331 Encounter for screening for depression: Secondary | ICD-10-CM | POA: Diagnosis not present

## 2022-02-06 IMAGING — CT CT HEAD W/O CM
3 series · 15 of 45 positions shown, 18 images · non-contrast
Comparison: None.

CLINICAL DATA: Status post fall.

EXAM:
CT HEAD WITHOUT CONTRAST
TECHNIQUE: Contiguous axial images were obtained from the base of the skull
through the vertex without intravenous contrast.

[Series 3: head wo · axial · 0.42mm/px · z∈[-95,+20]mm · 9 of 28 slices shown, 12 images]
[im 3/28  brain]
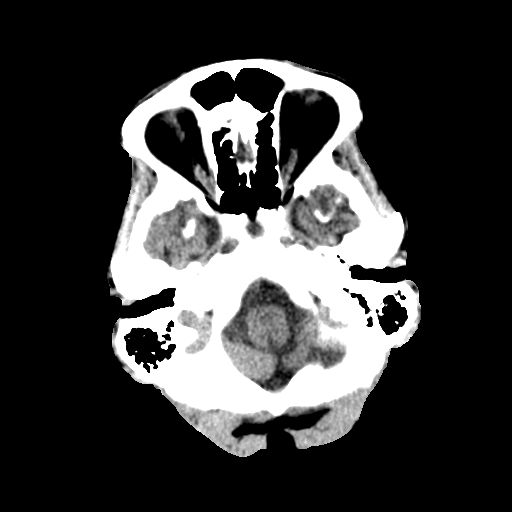
[im 3/28  bone]
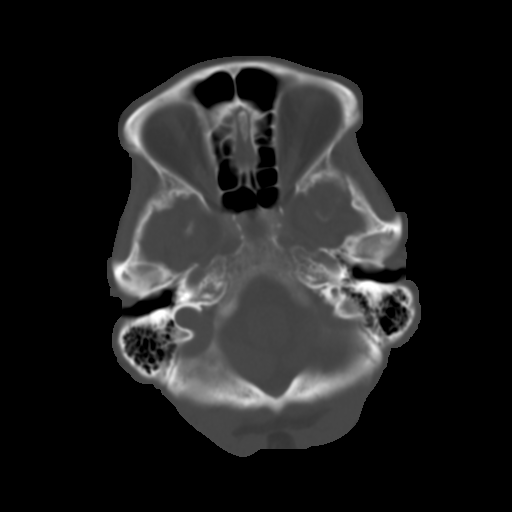
[im 6/28  brain]
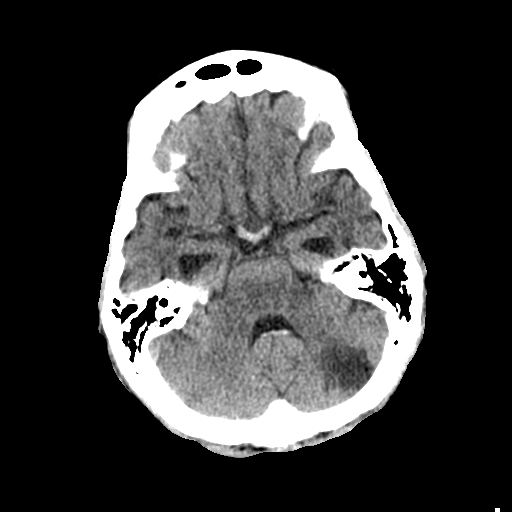
[im 9/28  brain]
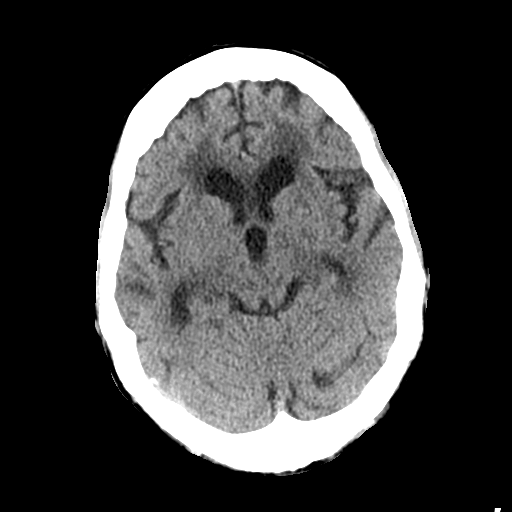
[im 12/28  brain]
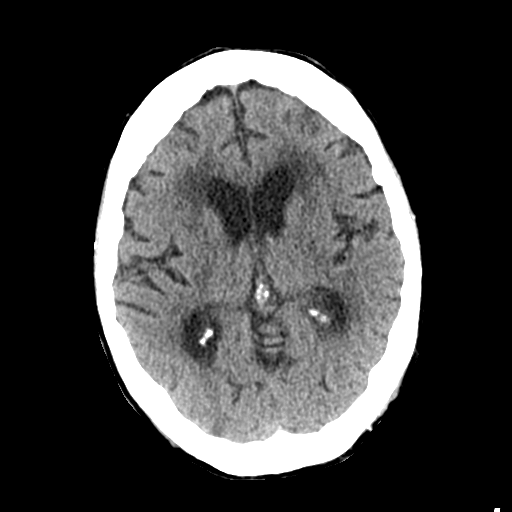
[im 15/28  brain]
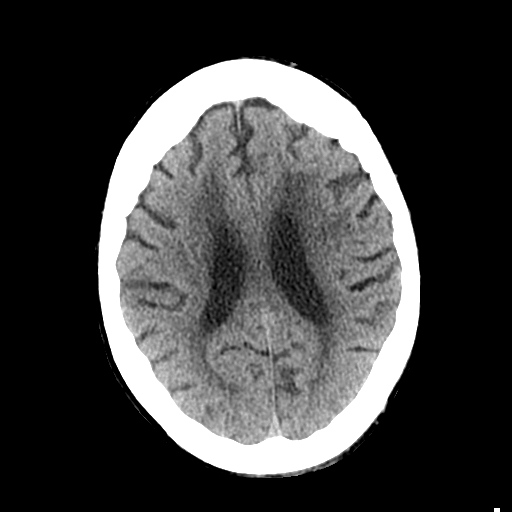
[im 15/28  bone]
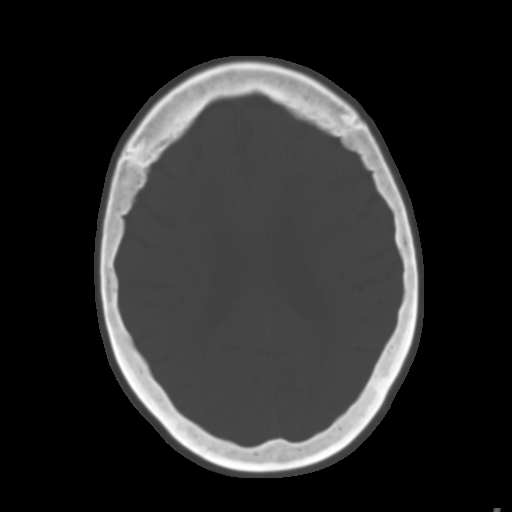
[im 17/28  brain]
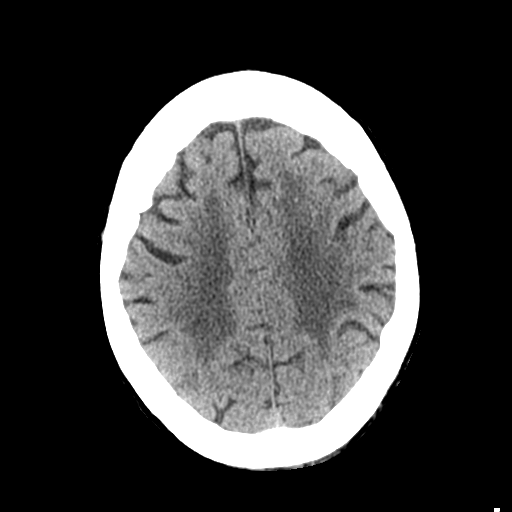
[im 20/28  brain]
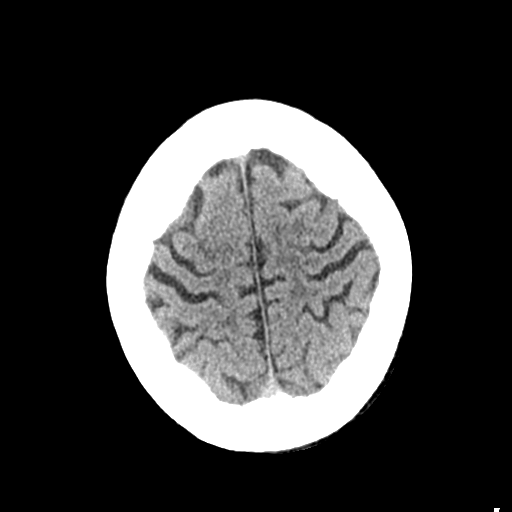
[im 23/28  brain]
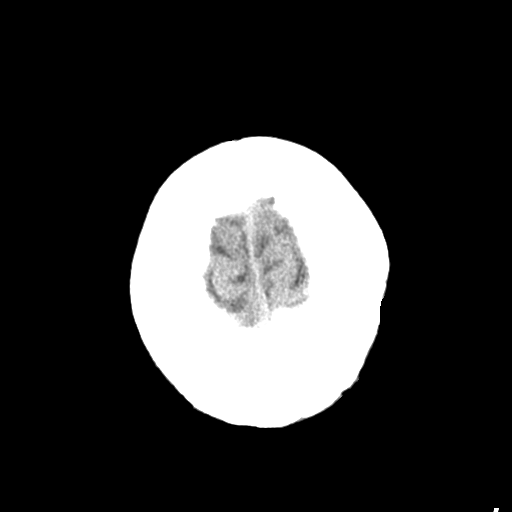
[im 26/28  brain]
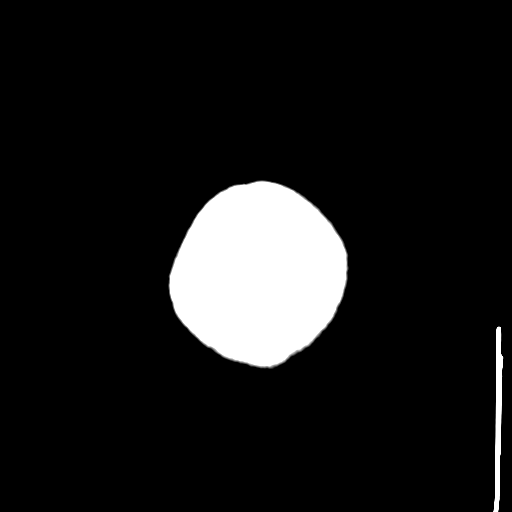
[im 26/28  bone]
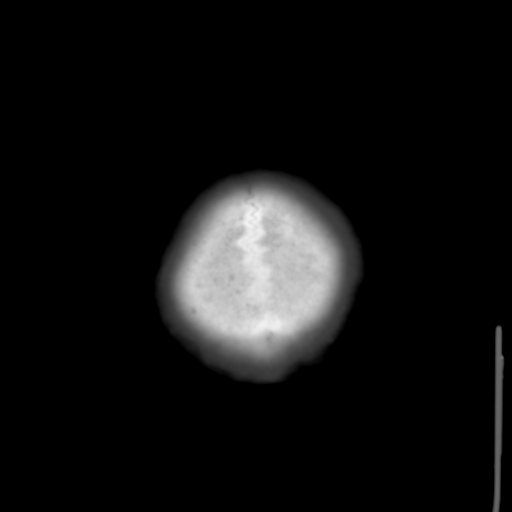

[Series 4: coronal soft tissue · coronal · 0.31mm/px · 3 of 66 slices shown]
[im 22/66  brain]
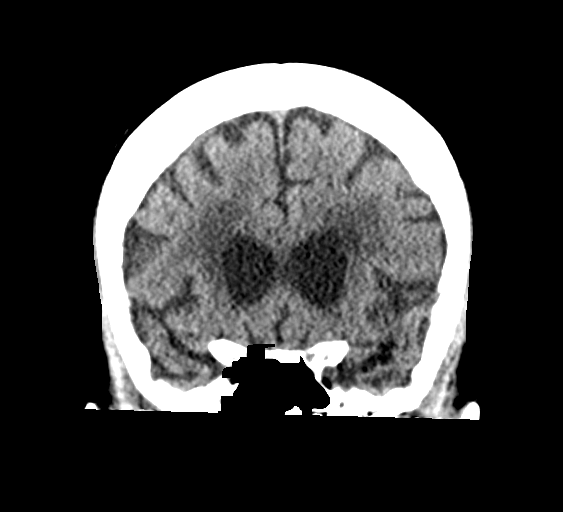
[im 29/66  brain]
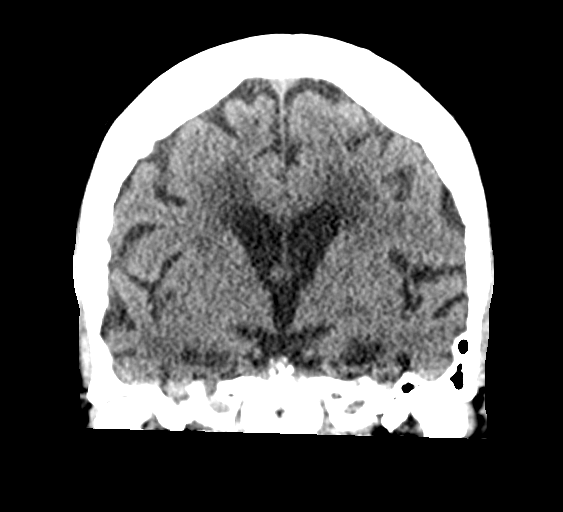
[im 37/66  brain]
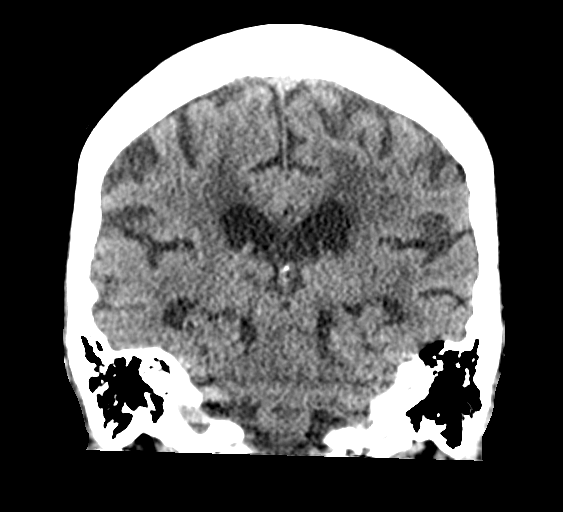

[Series 5: sagittal soft tissue · sagittal · 0.32mm/px · 3 of 52 slices shown]
[im 18/52  brain]
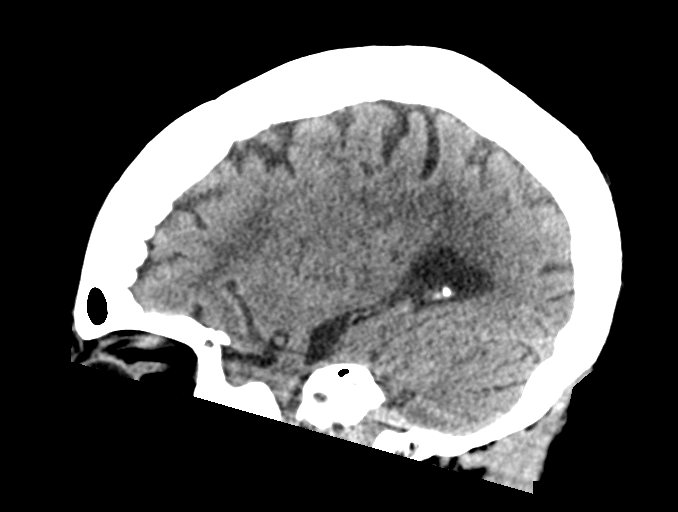
[im 26/52  brain]
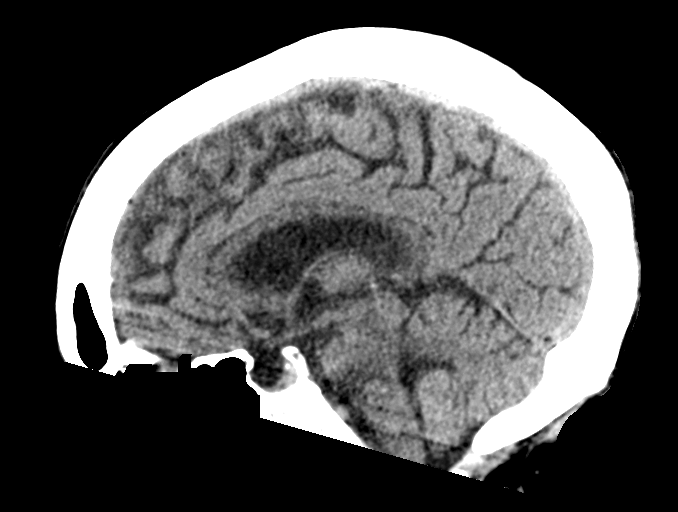
[im 35/52  brain]
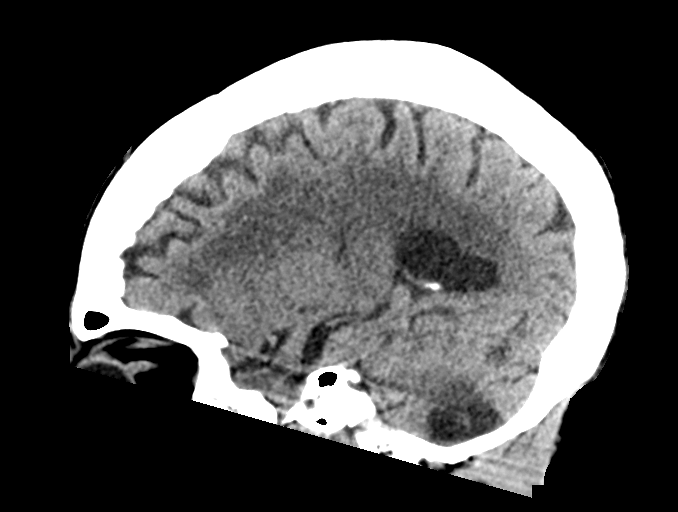

[15 of 45 positions shown; findings below may reference images not displayed]

FINDINGS: Brain: There is mild cerebral atrophy with widening of the
extra-axial spaces and ventricular dilatation.
There are areas of decreased attenuation within the white matter
tracts of the supratentorial brain, consistent with microvascular
disease changes.

A chronic left cerebellar infarct is seen.

Vascular: No hyperdense vessel or unexpected calcification.

Skull: Normal. Negative for fracture or focal lesion.

Sinuses/Orbits: No acute finding.

Other: None.
IMPRESSION: 1. Generalized cerebral atrophy.
2. Chronic left cerebellar infarct.
3. No acute intracranial abnormality.

## 2022-02-06 IMAGING — CT CT CERVICAL SPINE W/O CM
3 of 4 series · 11 of 33 positions shown, 13 images · non-contrast
Comparison: None.

CLINICAL DATA: Status post fall.

EXAM:
CT CERVICAL SPINE WITHOUT CONTRAST
TECHNIQUE: Multidetector CT imaging of the cervical spine was performed without
intravenous contrast. Multiplanar CT image reconstructions were also
generated.

[Series 6: sagittal bone · sagittal · 0.26mm/px · 5 of 53 slices shown, 6 images]
[im 18/53  bone]
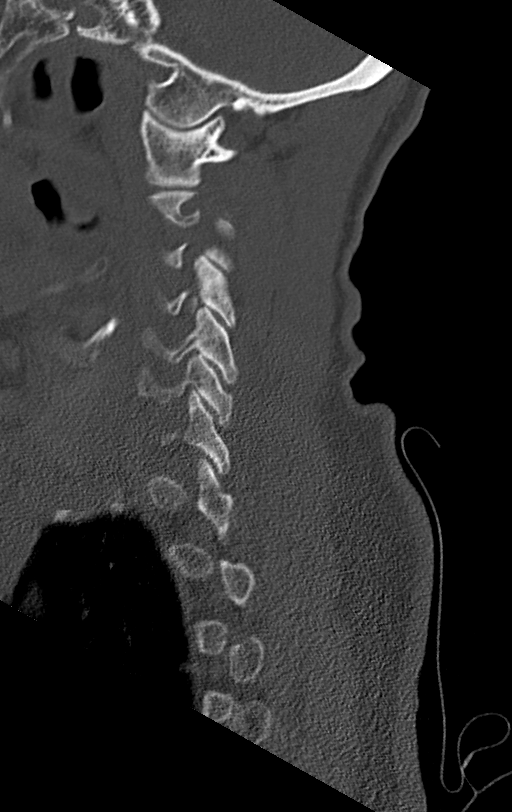
[im 22/53  bone]
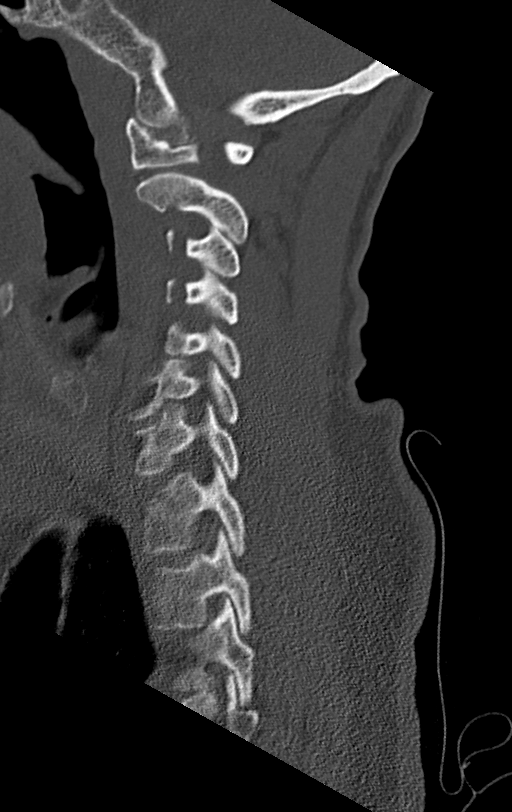
[im 27/53  soft-tissue]
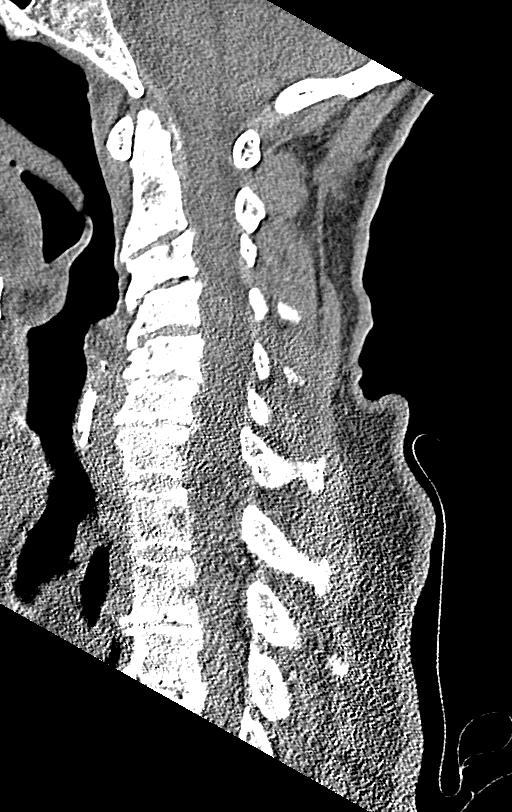
[im 27/53  bone]
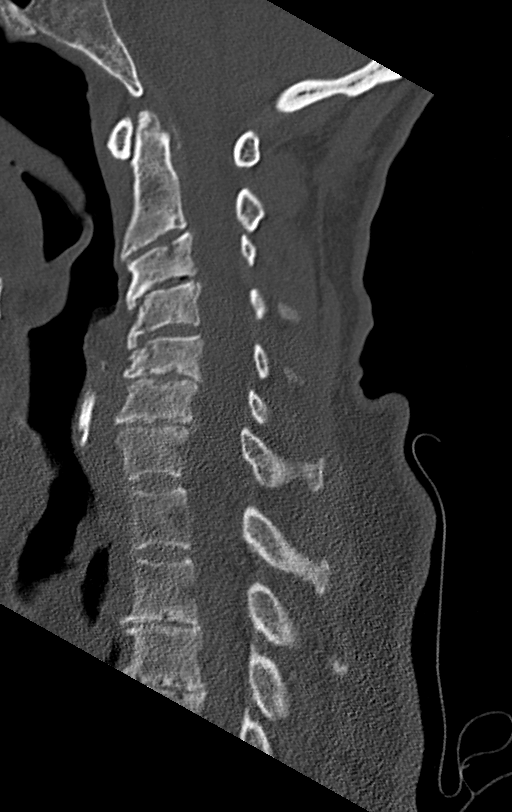
[im 31/53  bone]
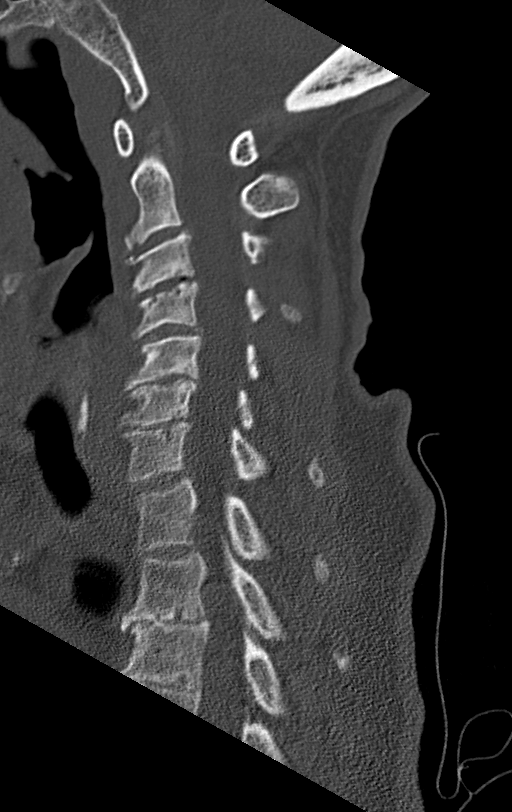
[im 35/53  bone]
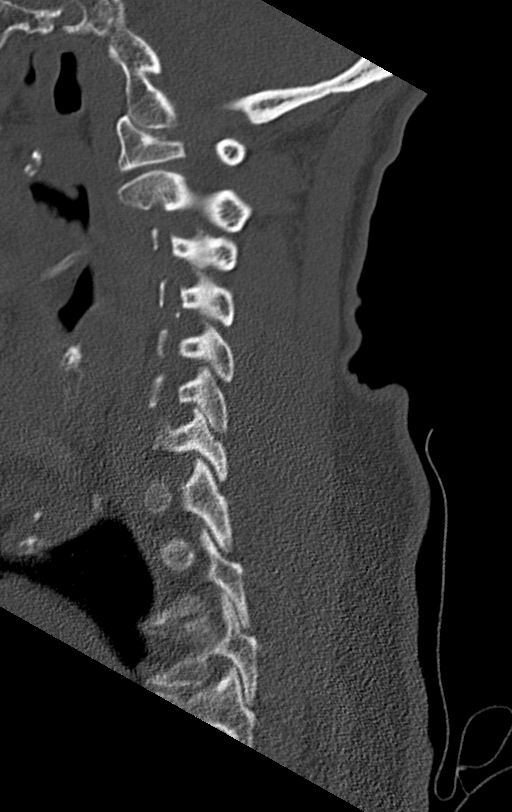

[Series 7: coronal bone · coronal · 0.20mm/px · 3 of 48 slices shown]
[im 10/48  bone]
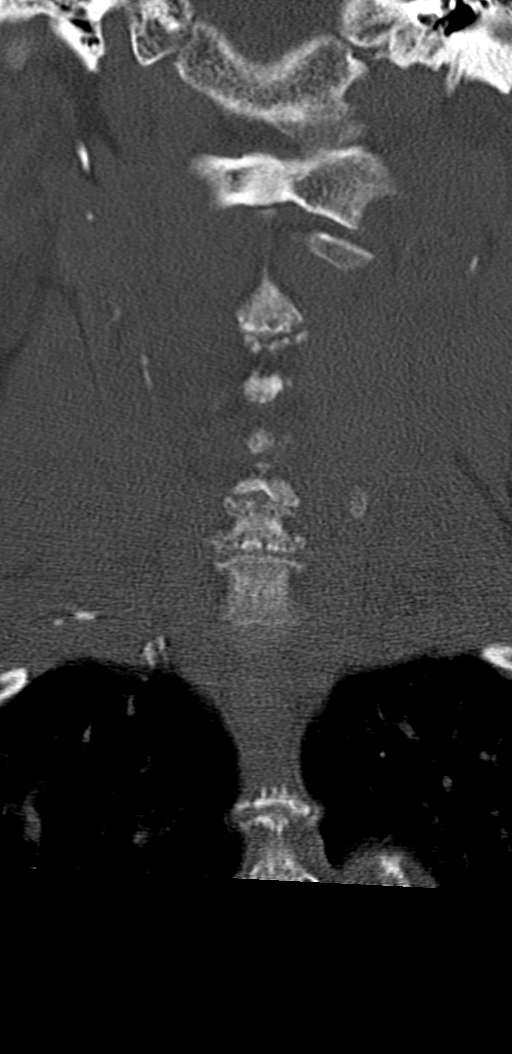
[im 19/48  bone]
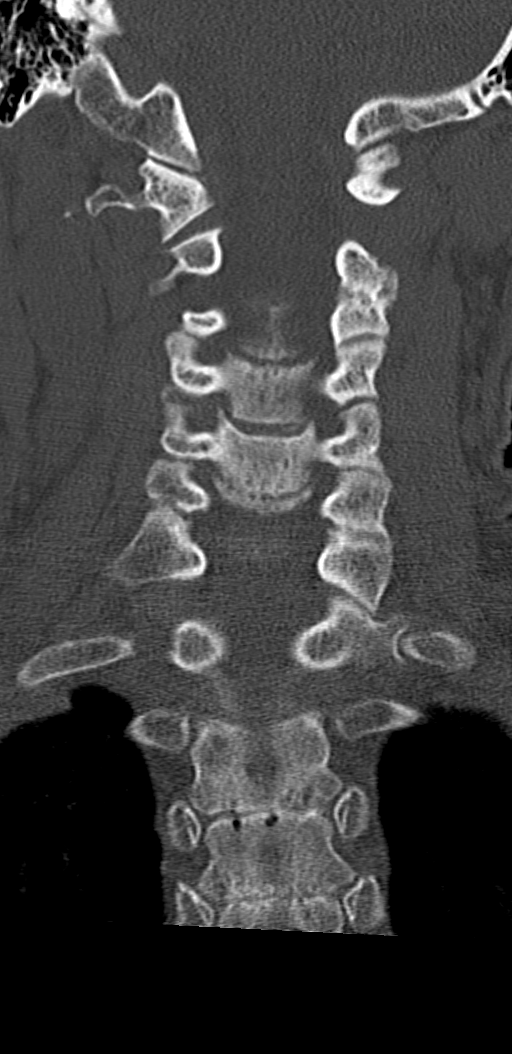
[im 29/48  bone]
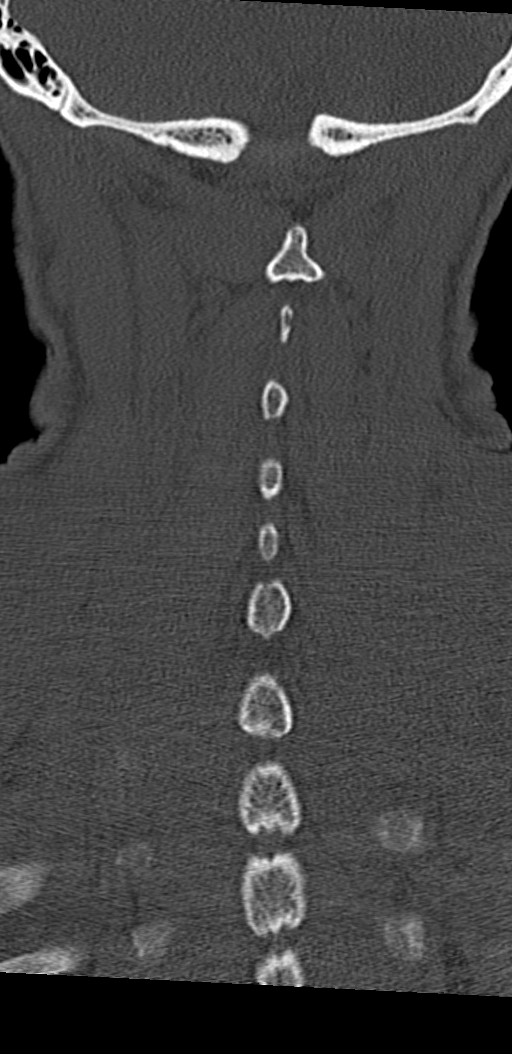

[Series 8: orthogonal bone · axial · 0.23mm/px · z∈[-238,-132]mm · 3 of 108 slices shown, 4 images]
[im 31/108  soft-tissue]
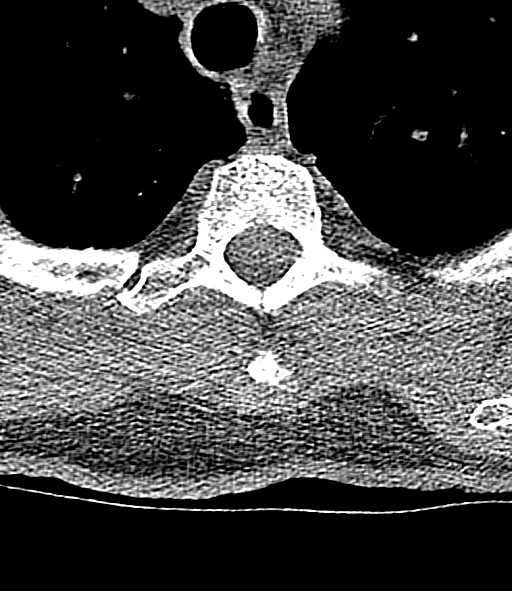
[im 31/108  bone]
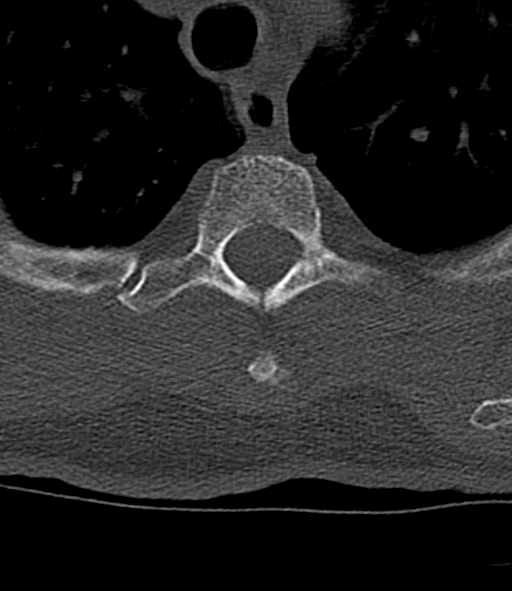
[im 62/108  bone]
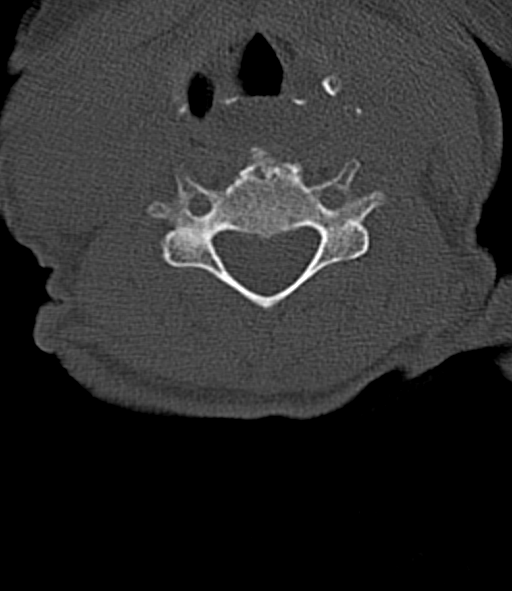
[im 92/108  bone]
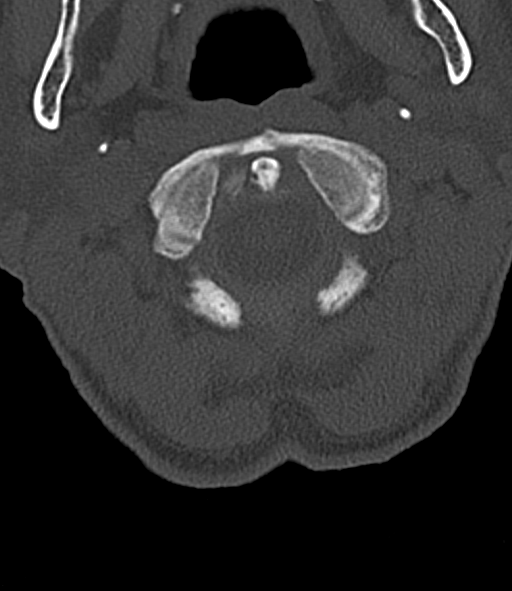

[11 of 33 positions shown; findings below may reference images not displayed]

FINDINGS: Alignment: Normal.

Skull base and vertebrae: No acute fracture. No primary bone lesion
or focal pathologic process.

Soft tissues and spinal canal: No prevertebral fluid or swelling. No
visible canal hematoma.

Disc levels: Moderate to marked severity endplate sclerosis is seen
at the levels of C2-C3, C3-C4, C4-C5, C5-C6 and C6-C7.

Moderate to marked severity intervertebral disc space narrowing is
also seen at the levels of C2-C3, C3-C4, C4-C5, C5-C6 and C6-C7.

Bilateral mild-to-moderate severity multilevel facet joint
hypertrophy is noted. This is most prominent at the level C2-C3.

Upper chest: Negative.

Other: None.
IMPRESSION: 1. Moderate to marked severity multilevel degenerative changes, most
prominent at the levels of C2-C3, C3-C4, C4-C5, C5-C6 and C6-C7.
2. No evidence of an acute fracture or subluxation.

## 2022-02-11 DIAGNOSIS — I10 Essential (primary) hypertension: Secondary | ICD-10-CM | POA: Diagnosis not present

## 2022-02-11 DIAGNOSIS — F01518 Vascular dementia, unspecified severity, with other behavioral disturbance: Secondary | ICD-10-CM | POA: Diagnosis not present

## 2022-03-11 DIAGNOSIS — R531 Weakness: Secondary | ICD-10-CM | POA: Diagnosis not present

## 2022-03-11 DIAGNOSIS — I69392 Facial weakness following cerebral infarction: Secondary | ICD-10-CM | POA: Diagnosis not present

## 2022-03-11 DIAGNOSIS — I69391 Dysphagia following cerebral infarction: Secondary | ICD-10-CM | POA: Diagnosis not present

## 2022-03-11 DIAGNOSIS — G5603 Carpal tunnel syndrome, bilateral upper limbs: Secondary | ICD-10-CM | POA: Diagnosis not present

## 2022-03-11 DIAGNOSIS — I69398 Other sequelae of cerebral infarction: Secondary | ICD-10-CM | POA: Diagnosis not present

## 2022-03-11 DIAGNOSIS — I69328 Other speech and language deficits following cerebral infarction: Secondary | ICD-10-CM | POA: Diagnosis not present

## 2022-03-11 DIAGNOSIS — M1990 Unspecified osteoarthritis, unspecified site: Secondary | ICD-10-CM | POA: Diagnosis not present

## 2022-03-11 DIAGNOSIS — I10 Essential (primary) hypertension: Secondary | ICD-10-CM | POA: Diagnosis not present

## 2022-03-11 DIAGNOSIS — E119 Type 2 diabetes mellitus without complications: Secondary | ICD-10-CM | POA: Diagnosis not present

## 2022-03-12 DIAGNOSIS — E119 Type 2 diabetes mellitus without complications: Secondary | ICD-10-CM | POA: Diagnosis not present

## 2022-03-12 DIAGNOSIS — R531 Weakness: Secondary | ICD-10-CM | POA: Diagnosis not present

## 2022-03-12 DIAGNOSIS — I69392 Facial weakness following cerebral infarction: Secondary | ICD-10-CM | POA: Diagnosis not present

## 2022-03-12 DIAGNOSIS — G5603 Carpal tunnel syndrome, bilateral upper limbs: Secondary | ICD-10-CM | POA: Diagnosis not present

## 2022-03-12 DIAGNOSIS — I69328 Other speech and language deficits following cerebral infarction: Secondary | ICD-10-CM | POA: Diagnosis not present

## 2022-03-12 DIAGNOSIS — I69391 Dysphagia following cerebral infarction: Secondary | ICD-10-CM | POA: Diagnosis not present

## 2022-03-12 DIAGNOSIS — I69398 Other sequelae of cerebral infarction: Secondary | ICD-10-CM | POA: Diagnosis not present

## 2022-03-12 DIAGNOSIS — M1990 Unspecified osteoarthritis, unspecified site: Secondary | ICD-10-CM | POA: Diagnosis not present

## 2022-03-12 DIAGNOSIS — I10 Essential (primary) hypertension: Secondary | ICD-10-CM | POA: Diagnosis not present

## 2022-03-13 DIAGNOSIS — R531 Weakness: Secondary | ICD-10-CM | POA: Diagnosis not present

## 2022-03-13 DIAGNOSIS — I69398 Other sequelae of cerebral infarction: Secondary | ICD-10-CM | POA: Diagnosis not present

## 2022-03-13 DIAGNOSIS — M1990 Unspecified osteoarthritis, unspecified site: Secondary | ICD-10-CM | POA: Diagnosis not present

## 2022-03-13 DIAGNOSIS — I69391 Dysphagia following cerebral infarction: Secondary | ICD-10-CM | POA: Diagnosis not present

## 2022-03-13 DIAGNOSIS — G5603 Carpal tunnel syndrome, bilateral upper limbs: Secondary | ICD-10-CM | POA: Diagnosis not present

## 2022-03-13 DIAGNOSIS — I69328 Other speech and language deficits following cerebral infarction: Secondary | ICD-10-CM | POA: Diagnosis not present

## 2022-03-13 DIAGNOSIS — I69392 Facial weakness following cerebral infarction: Secondary | ICD-10-CM | POA: Diagnosis not present

## 2022-03-13 DIAGNOSIS — I10 Essential (primary) hypertension: Secondary | ICD-10-CM | POA: Diagnosis not present

## 2022-03-13 DIAGNOSIS — E119 Type 2 diabetes mellitus without complications: Secondary | ICD-10-CM | POA: Diagnosis not present

## 2022-03-18 DIAGNOSIS — I69328 Other speech and language deficits following cerebral infarction: Secondary | ICD-10-CM | POA: Diagnosis not present

## 2022-03-18 DIAGNOSIS — I69391 Dysphagia following cerebral infarction: Secondary | ICD-10-CM | POA: Diagnosis not present

## 2022-03-18 DIAGNOSIS — I10 Essential (primary) hypertension: Secondary | ICD-10-CM | POA: Diagnosis not present

## 2022-03-18 DIAGNOSIS — I69398 Other sequelae of cerebral infarction: Secondary | ICD-10-CM | POA: Diagnosis not present

## 2022-03-18 DIAGNOSIS — I69392 Facial weakness following cerebral infarction: Secondary | ICD-10-CM | POA: Diagnosis not present

## 2022-03-18 DIAGNOSIS — G5603 Carpal tunnel syndrome, bilateral upper limbs: Secondary | ICD-10-CM | POA: Diagnosis not present

## 2022-03-18 DIAGNOSIS — E119 Type 2 diabetes mellitus without complications: Secondary | ICD-10-CM | POA: Diagnosis not present

## 2022-03-18 DIAGNOSIS — M1990 Unspecified osteoarthritis, unspecified site: Secondary | ICD-10-CM | POA: Diagnosis not present

## 2022-03-18 DIAGNOSIS — R531 Weakness: Secondary | ICD-10-CM | POA: Diagnosis not present

## 2022-03-19 DIAGNOSIS — I69328 Other speech and language deficits following cerebral infarction: Secondary | ICD-10-CM | POA: Diagnosis not present

## 2022-03-19 DIAGNOSIS — I69392 Facial weakness following cerebral infarction: Secondary | ICD-10-CM | POA: Diagnosis not present

## 2022-03-19 DIAGNOSIS — R531 Weakness: Secondary | ICD-10-CM | POA: Diagnosis not present

## 2022-03-19 DIAGNOSIS — E119 Type 2 diabetes mellitus without complications: Secondary | ICD-10-CM | POA: Diagnosis not present

## 2022-03-19 DIAGNOSIS — M1990 Unspecified osteoarthritis, unspecified site: Secondary | ICD-10-CM | POA: Diagnosis not present

## 2022-03-19 DIAGNOSIS — I69398 Other sequelae of cerebral infarction: Secondary | ICD-10-CM | POA: Diagnosis not present

## 2022-03-19 DIAGNOSIS — I10 Essential (primary) hypertension: Secondary | ICD-10-CM | POA: Diagnosis not present

## 2022-03-19 DIAGNOSIS — I69391 Dysphagia following cerebral infarction: Secondary | ICD-10-CM | POA: Diagnosis not present

## 2022-03-19 DIAGNOSIS — G5603 Carpal tunnel syndrome, bilateral upper limbs: Secondary | ICD-10-CM | POA: Diagnosis not present

## 2022-03-20 DIAGNOSIS — G5603 Carpal tunnel syndrome, bilateral upper limbs: Secondary | ICD-10-CM | POA: Diagnosis not present

## 2022-03-20 DIAGNOSIS — E119 Type 2 diabetes mellitus without complications: Secondary | ICD-10-CM | POA: Diagnosis not present

## 2022-03-20 DIAGNOSIS — I69398 Other sequelae of cerebral infarction: Secondary | ICD-10-CM | POA: Diagnosis not present

## 2022-03-20 DIAGNOSIS — I69391 Dysphagia following cerebral infarction: Secondary | ICD-10-CM | POA: Diagnosis not present

## 2022-03-20 DIAGNOSIS — I10 Essential (primary) hypertension: Secondary | ICD-10-CM | POA: Diagnosis not present

## 2022-03-20 DIAGNOSIS — I69328 Other speech and language deficits following cerebral infarction: Secondary | ICD-10-CM | POA: Diagnosis not present

## 2022-03-20 DIAGNOSIS — R531 Weakness: Secondary | ICD-10-CM | POA: Diagnosis not present

## 2022-03-20 DIAGNOSIS — M1990 Unspecified osteoarthritis, unspecified site: Secondary | ICD-10-CM | POA: Diagnosis not present

## 2022-03-20 DIAGNOSIS — I69392 Facial weakness following cerebral infarction: Secondary | ICD-10-CM | POA: Diagnosis not present

## 2022-03-23 DIAGNOSIS — I69392 Facial weakness following cerebral infarction: Secondary | ICD-10-CM | POA: Diagnosis not present

## 2022-03-23 DIAGNOSIS — E119 Type 2 diabetes mellitus without complications: Secondary | ICD-10-CM | POA: Diagnosis not present

## 2022-03-23 DIAGNOSIS — I69398 Other sequelae of cerebral infarction: Secondary | ICD-10-CM | POA: Diagnosis not present

## 2022-03-23 DIAGNOSIS — R531 Weakness: Secondary | ICD-10-CM | POA: Diagnosis not present

## 2022-03-23 DIAGNOSIS — I69391 Dysphagia following cerebral infarction: Secondary | ICD-10-CM | POA: Diagnosis not present

## 2022-03-23 DIAGNOSIS — M1990 Unspecified osteoarthritis, unspecified site: Secondary | ICD-10-CM | POA: Diagnosis not present

## 2022-03-23 DIAGNOSIS — G5603 Carpal tunnel syndrome, bilateral upper limbs: Secondary | ICD-10-CM | POA: Diagnosis not present

## 2022-03-23 DIAGNOSIS — I10 Essential (primary) hypertension: Secondary | ICD-10-CM | POA: Diagnosis not present

## 2022-03-23 DIAGNOSIS — I69328 Other speech and language deficits following cerebral infarction: Secondary | ICD-10-CM | POA: Diagnosis not present

## 2022-03-25 DIAGNOSIS — E119 Type 2 diabetes mellitus without complications: Secondary | ICD-10-CM | POA: Diagnosis not present

## 2022-03-25 DIAGNOSIS — I69392 Facial weakness following cerebral infarction: Secondary | ICD-10-CM | POA: Diagnosis not present

## 2022-03-25 DIAGNOSIS — G5603 Carpal tunnel syndrome, bilateral upper limbs: Secondary | ICD-10-CM | POA: Diagnosis not present

## 2022-03-25 DIAGNOSIS — I69328 Other speech and language deficits following cerebral infarction: Secondary | ICD-10-CM | POA: Diagnosis not present

## 2022-03-25 DIAGNOSIS — I69391 Dysphagia following cerebral infarction: Secondary | ICD-10-CM | POA: Diagnosis not present

## 2022-03-25 DIAGNOSIS — M1990 Unspecified osteoarthritis, unspecified site: Secondary | ICD-10-CM | POA: Diagnosis not present

## 2022-03-25 DIAGNOSIS — I69398 Other sequelae of cerebral infarction: Secondary | ICD-10-CM | POA: Diagnosis not present

## 2022-03-25 DIAGNOSIS — R531 Weakness: Secondary | ICD-10-CM | POA: Diagnosis not present

## 2022-03-25 DIAGNOSIS — I10 Essential (primary) hypertension: Secondary | ICD-10-CM | POA: Diagnosis not present

## 2022-03-26 DIAGNOSIS — R531 Weakness: Secondary | ICD-10-CM | POA: Diagnosis not present

## 2022-03-26 DIAGNOSIS — I69392 Facial weakness following cerebral infarction: Secondary | ICD-10-CM | POA: Diagnosis not present

## 2022-03-26 DIAGNOSIS — I69391 Dysphagia following cerebral infarction: Secondary | ICD-10-CM | POA: Diagnosis not present

## 2022-03-26 DIAGNOSIS — I69398 Other sequelae of cerebral infarction: Secondary | ICD-10-CM | POA: Diagnosis not present

## 2022-03-26 DIAGNOSIS — I69328 Other speech and language deficits following cerebral infarction: Secondary | ICD-10-CM | POA: Diagnosis not present

## 2022-03-26 DIAGNOSIS — G5603 Carpal tunnel syndrome, bilateral upper limbs: Secondary | ICD-10-CM | POA: Diagnosis not present

## 2022-03-26 DIAGNOSIS — E119 Type 2 diabetes mellitus without complications: Secondary | ICD-10-CM | POA: Diagnosis not present

## 2022-03-26 DIAGNOSIS — I10 Essential (primary) hypertension: Secondary | ICD-10-CM | POA: Diagnosis not present

## 2022-03-26 DIAGNOSIS — M1990 Unspecified osteoarthritis, unspecified site: Secondary | ICD-10-CM | POA: Diagnosis not present

## 2022-03-27 DIAGNOSIS — I69391 Dysphagia following cerebral infarction: Secondary | ICD-10-CM | POA: Diagnosis not present

## 2022-03-27 DIAGNOSIS — I69328 Other speech and language deficits following cerebral infarction: Secondary | ICD-10-CM | POA: Diagnosis not present

## 2022-03-27 DIAGNOSIS — I69398 Other sequelae of cerebral infarction: Secondary | ICD-10-CM | POA: Diagnosis not present

## 2022-03-27 DIAGNOSIS — I69392 Facial weakness following cerebral infarction: Secondary | ICD-10-CM | POA: Diagnosis not present

## 2022-03-27 DIAGNOSIS — M1990 Unspecified osteoarthritis, unspecified site: Secondary | ICD-10-CM | POA: Diagnosis not present

## 2022-03-27 DIAGNOSIS — G5603 Carpal tunnel syndrome, bilateral upper limbs: Secondary | ICD-10-CM | POA: Diagnosis not present

## 2022-03-27 DIAGNOSIS — R531 Weakness: Secondary | ICD-10-CM | POA: Diagnosis not present

## 2022-03-27 DIAGNOSIS — E119 Type 2 diabetes mellitus without complications: Secondary | ICD-10-CM | POA: Diagnosis not present

## 2022-03-27 DIAGNOSIS — I10 Essential (primary) hypertension: Secondary | ICD-10-CM | POA: Diagnosis not present

## 2022-03-31 DIAGNOSIS — G5603 Carpal tunnel syndrome, bilateral upper limbs: Secondary | ICD-10-CM | POA: Diagnosis not present

## 2022-03-31 DIAGNOSIS — I69391 Dysphagia following cerebral infarction: Secondary | ICD-10-CM | POA: Diagnosis not present

## 2022-03-31 DIAGNOSIS — E119 Type 2 diabetes mellitus without complications: Secondary | ICD-10-CM | POA: Diagnosis not present

## 2022-03-31 DIAGNOSIS — I69398 Other sequelae of cerebral infarction: Secondary | ICD-10-CM | POA: Diagnosis not present

## 2022-03-31 DIAGNOSIS — M1990 Unspecified osteoarthritis, unspecified site: Secondary | ICD-10-CM | POA: Diagnosis not present

## 2022-03-31 DIAGNOSIS — R531 Weakness: Secondary | ICD-10-CM | POA: Diagnosis not present

## 2022-03-31 DIAGNOSIS — I10 Essential (primary) hypertension: Secondary | ICD-10-CM | POA: Diagnosis not present

## 2022-03-31 DIAGNOSIS — I69328 Other speech and language deficits following cerebral infarction: Secondary | ICD-10-CM | POA: Diagnosis not present

## 2022-03-31 DIAGNOSIS — I69392 Facial weakness following cerebral infarction: Secondary | ICD-10-CM | POA: Diagnosis not present

## 2022-04-02 DIAGNOSIS — I10 Essential (primary) hypertension: Secondary | ICD-10-CM | POA: Diagnosis not present

## 2022-04-02 DIAGNOSIS — M1990 Unspecified osteoarthritis, unspecified site: Secondary | ICD-10-CM | POA: Diagnosis not present

## 2022-04-02 DIAGNOSIS — R531 Weakness: Secondary | ICD-10-CM | POA: Diagnosis not present

## 2022-04-02 DIAGNOSIS — I69392 Facial weakness following cerebral infarction: Secondary | ICD-10-CM | POA: Diagnosis not present

## 2022-04-02 DIAGNOSIS — I69328 Other speech and language deficits following cerebral infarction: Secondary | ICD-10-CM | POA: Diagnosis not present

## 2022-04-02 DIAGNOSIS — G5603 Carpal tunnel syndrome, bilateral upper limbs: Secondary | ICD-10-CM | POA: Diagnosis not present

## 2022-04-02 DIAGNOSIS — E119 Type 2 diabetes mellitus without complications: Secondary | ICD-10-CM | POA: Diagnosis not present

## 2022-04-02 DIAGNOSIS — I69398 Other sequelae of cerebral infarction: Secondary | ICD-10-CM | POA: Diagnosis not present

## 2022-04-02 DIAGNOSIS — I69391 Dysphagia following cerebral infarction: Secondary | ICD-10-CM | POA: Diagnosis not present

## 2022-04-07 DIAGNOSIS — I129 Hypertensive chronic kidney disease with stage 1 through stage 4 chronic kidney disease, or unspecified chronic kidney disease: Secondary | ICD-10-CM | POA: Diagnosis not present

## 2022-04-07 DIAGNOSIS — I10 Essential (primary) hypertension: Secondary | ICD-10-CM | POA: Diagnosis not present

## 2022-04-07 DIAGNOSIS — N181 Chronic kidney disease, stage 1: Secondary | ICD-10-CM | POA: Diagnosis not present

## 2022-04-07 DIAGNOSIS — E039 Hypothyroidism, unspecified: Secondary | ICD-10-CM | POA: Diagnosis not present

## 2022-04-07 DIAGNOSIS — E1122 Type 2 diabetes mellitus with diabetic chronic kidney disease: Secondary | ICD-10-CM | POA: Diagnosis not present

## 2022-04-07 DIAGNOSIS — F01518 Vascular dementia, unspecified severity, with other behavioral disturbance: Secondary | ICD-10-CM | POA: Diagnosis not present

## 2022-04-07 DIAGNOSIS — I6330 Cerebral infarction due to thrombosis of unspecified cerebral artery: Secondary | ICD-10-CM | POA: Diagnosis not present

## 2022-04-07 DIAGNOSIS — K219 Gastro-esophageal reflux disease without esophagitis: Secondary | ICD-10-CM | POA: Diagnosis not present

## 2022-04-09 DIAGNOSIS — I69398 Other sequelae of cerebral infarction: Secondary | ICD-10-CM | POA: Diagnosis not present

## 2022-04-09 DIAGNOSIS — I69391 Dysphagia following cerebral infarction: Secondary | ICD-10-CM | POA: Diagnosis not present

## 2022-04-09 DIAGNOSIS — I69392 Facial weakness following cerebral infarction: Secondary | ICD-10-CM | POA: Diagnosis not present

## 2022-04-09 DIAGNOSIS — R531 Weakness: Secondary | ICD-10-CM | POA: Diagnosis not present

## 2022-04-09 DIAGNOSIS — G5603 Carpal tunnel syndrome, bilateral upper limbs: Secondary | ICD-10-CM | POA: Diagnosis not present

## 2022-04-09 DIAGNOSIS — I10 Essential (primary) hypertension: Secondary | ICD-10-CM | POA: Diagnosis not present

## 2022-04-09 DIAGNOSIS — I69328 Other speech and language deficits following cerebral infarction: Secondary | ICD-10-CM | POA: Diagnosis not present

## 2022-04-09 DIAGNOSIS — M1990 Unspecified osteoarthritis, unspecified site: Secondary | ICD-10-CM | POA: Diagnosis not present

## 2022-04-09 DIAGNOSIS — E119 Type 2 diabetes mellitus without complications: Secondary | ICD-10-CM | POA: Diagnosis not present

## 2022-05-03 DIAGNOSIS — E1122 Type 2 diabetes mellitus with diabetic chronic kidney disease: Secondary | ICD-10-CM | POA: Diagnosis not present

## 2022-05-03 DIAGNOSIS — R32 Unspecified urinary incontinence: Secondary | ICD-10-CM | POA: Diagnosis not present

## 2022-05-11 DIAGNOSIS — F03911 Unspecified dementia, unspecified severity, with agitation: Secondary | ICD-10-CM | POA: Diagnosis not present

## 2022-05-11 DIAGNOSIS — F22 Delusional disorders: Secondary | ICD-10-CM | POA: Diagnosis not present

## 2022-05-15 DIAGNOSIS — R350 Frequency of micturition: Secondary | ICD-10-CM | POA: Diagnosis not present

## 2022-05-19 DIAGNOSIS — E119 Type 2 diabetes mellitus without complications: Secondary | ICD-10-CM | POA: Diagnosis not present

## 2022-06-11 DIAGNOSIS — R399 Unspecified symptoms and signs involving the genitourinary system: Secondary | ICD-10-CM | POA: Diagnosis not present

## 2022-06-11 DIAGNOSIS — Z13820 Encounter for screening for osteoporosis: Secondary | ICD-10-CM | POA: Diagnosis not present

## 2022-06-11 DIAGNOSIS — Z5320 Procedure and treatment not carried out because of patient's decision for unspecified reasons: Secondary | ICD-10-CM | POA: Diagnosis not present

## 2022-06-11 DIAGNOSIS — E1122 Type 2 diabetes mellitus with diabetic chronic kidney disease: Secondary | ICD-10-CM | POA: Diagnosis not present

## 2022-06-11 DIAGNOSIS — N181 Chronic kidney disease, stage 1: Secondary | ICD-10-CM | POA: Diagnosis not present

## 2022-07-03 DIAGNOSIS — M8589 Other specified disorders of bone density and structure, multiple sites: Secondary | ICD-10-CM | POA: Diagnosis not present

## 2022-08-03 DIAGNOSIS — R32 Unspecified urinary incontinence: Secondary | ICD-10-CM | POA: Diagnosis not present

## 2022-08-21 DIAGNOSIS — I10 Essential (primary) hypertension: Secondary | ICD-10-CM | POA: Diagnosis not present

## 2022-08-21 DIAGNOSIS — E1122 Type 2 diabetes mellitus with diabetic chronic kidney disease: Secondary | ICD-10-CM | POA: Diagnosis not present

## 2022-08-21 DIAGNOSIS — N181 Chronic kidney disease, stage 1: Secondary | ICD-10-CM | POA: Diagnosis not present

## 2022-08-21 DIAGNOSIS — R350 Frequency of micturition: Secondary | ICD-10-CM | POA: Diagnosis not present

## 2022-08-21 DIAGNOSIS — F01518 Vascular dementia, unspecified severity, with other behavioral disturbance: Secondary | ICD-10-CM | POA: Diagnosis not present

## 2022-08-24 DIAGNOSIS — I10 Essential (primary) hypertension: Secondary | ICD-10-CM | POA: Diagnosis not present

## 2022-08-24 DIAGNOSIS — F01518 Vascular dementia, unspecified severity, with other behavioral disturbance: Secondary | ICD-10-CM | POA: Diagnosis not present

## 2022-08-31 DIAGNOSIS — R32 Unspecified urinary incontinence: Secondary | ICD-10-CM | POA: Diagnosis not present

## 2022-09-01 DIAGNOSIS — I10 Essential (primary) hypertension: Secondary | ICD-10-CM | POA: Diagnosis not present

## 2022-09-28 DIAGNOSIS — F01518 Vascular dementia, unspecified severity, with other behavioral disturbance: Secondary | ICD-10-CM | POA: Diagnosis not present

## 2022-09-28 DIAGNOSIS — I10 Essential (primary) hypertension: Secondary | ICD-10-CM | POA: Diagnosis not present

## 2022-11-05 DIAGNOSIS — R32 Unspecified urinary incontinence: Secondary | ICD-10-CM | POA: Diagnosis not present

## 2022-11-05 DIAGNOSIS — E1122 Type 2 diabetes mellitus with diabetic chronic kidney disease: Secondary | ICD-10-CM | POA: Diagnosis not present

## 2022-11-13 DIAGNOSIS — Z23 Encounter for immunization: Secondary | ICD-10-CM | POA: Diagnosis not present

## 2022-11-13 DIAGNOSIS — E1122 Type 2 diabetes mellitus with diabetic chronic kidney disease: Secondary | ICD-10-CM | POA: Diagnosis not present

## 2022-11-13 DIAGNOSIS — N181 Chronic kidney disease, stage 1: Secondary | ICD-10-CM | POA: Diagnosis not present

## 2022-11-13 DIAGNOSIS — E039 Hypothyroidism, unspecified: Secondary | ICD-10-CM | POA: Diagnosis not present

## 2022-11-13 DIAGNOSIS — K219 Gastro-esophageal reflux disease without esophagitis: Secondary | ICD-10-CM | POA: Diagnosis not present

## 2022-11-13 DIAGNOSIS — F01518 Vascular dementia, unspecified severity, with other behavioral disturbance: Secondary | ICD-10-CM | POA: Diagnosis not present

## 2022-11-13 DIAGNOSIS — Z8673 Personal history of transient ischemic attack (TIA), and cerebral infarction without residual deficits: Secondary | ICD-10-CM | POA: Diagnosis not present

## 2022-11-13 DIAGNOSIS — M8580 Other specified disorders of bone density and structure, unspecified site: Secondary | ICD-10-CM | POA: Diagnosis not present

## 2022-11-13 DIAGNOSIS — I10 Essential (primary) hypertension: Secondary | ICD-10-CM | POA: Diagnosis not present

## 2022-11-16 DIAGNOSIS — F01518 Vascular dementia, unspecified severity, with other behavioral disturbance: Secondary | ICD-10-CM | POA: Diagnosis not present

## 2022-11-16 DIAGNOSIS — G4720 Circadian rhythm sleep disorder, unspecified type: Secondary | ICD-10-CM | POA: Diagnosis not present

## 2022-11-16 DIAGNOSIS — F22 Delusional disorders: Secondary | ICD-10-CM | POA: Diagnosis not present

## 2022-11-16 DIAGNOSIS — R269 Unspecified abnormalities of gait and mobility: Secondary | ICD-10-CM | POA: Diagnosis not present

## 2023-02-03 DIAGNOSIS — R32 Unspecified urinary incontinence: Secondary | ICD-10-CM | POA: Diagnosis not present

## 2023-02-03 DIAGNOSIS — E1122 Type 2 diabetes mellitus with diabetic chronic kidney disease: Secondary | ICD-10-CM | POA: Diagnosis not present

## 2023-02-12 DIAGNOSIS — F01518 Vascular dementia, unspecified severity, with other behavioral disturbance: Secondary | ICD-10-CM | POA: Diagnosis not present

## 2023-02-12 DIAGNOSIS — N181 Chronic kidney disease, stage 1: Secondary | ICD-10-CM | POA: Diagnosis not present

## 2023-02-12 DIAGNOSIS — E039 Hypothyroidism, unspecified: Secondary | ICD-10-CM | POA: Diagnosis not present

## 2023-02-12 DIAGNOSIS — E1122 Type 2 diabetes mellitus with diabetic chronic kidney disease: Secondary | ICD-10-CM | POA: Diagnosis not present

## 2023-02-12 DIAGNOSIS — I10 Essential (primary) hypertension: Secondary | ICD-10-CM | POA: Diagnosis not present

## 2023-02-12 DIAGNOSIS — R300 Dysuria: Secondary | ICD-10-CM | POA: Diagnosis not present

## 2023-03-05 DIAGNOSIS — R300 Dysuria: Secondary | ICD-10-CM | POA: Diagnosis not present

## 2023-03-29 DIAGNOSIS — K5730 Diverticulosis of large intestine without perforation or abscess without bleeding: Secondary | ICD-10-CM | POA: Diagnosis not present

## 2023-03-29 DIAGNOSIS — K8689 Other specified diseases of pancreas: Secondary | ICD-10-CM | POA: Diagnosis not present

## 2023-03-29 DIAGNOSIS — K429 Umbilical hernia without obstruction or gangrene: Secondary | ICD-10-CM | POA: Diagnosis not present

## 2023-03-29 DIAGNOSIS — I700 Atherosclerosis of aorta: Secondary | ICD-10-CM | POA: Diagnosis not present

## 2023-03-29 DIAGNOSIS — R319 Hematuria, unspecified: Secondary | ICD-10-CM | POA: Diagnosis not present

## 2023-11-09 ENCOUNTER — Ambulatory Visit
Admission: EM | Admit: 2023-11-09 | Discharge: 2023-11-09 | Disposition: A | Discharge status: Home / Self Care | Attending: Emergency Medicine | Admitting: Emergency Medicine

## 2023-11-09 ENCOUNTER — Encounter: Payer: Self-pay | Admitting: Emergency Medicine

## 2023-11-09 DIAGNOSIS — B379 Candidiasis, unspecified: Secondary | ICD-10-CM | POA: Diagnosis not present

## 2023-11-09 DIAGNOSIS — F0390 Unspecified dementia without behavioral disturbance: Secondary | ICD-10-CM | POA: Diagnosis not present

## 2023-11-09 DIAGNOSIS — R350 Frequency of micturition: Secondary | ICD-10-CM | POA: Diagnosis not present

## 2023-11-09 DIAGNOSIS — N760 Acute vaginitis: Secondary | ICD-10-CM | POA: Diagnosis not present

## 2023-11-09 LAB — POCT URINE DIPSTICK
Bilirubin, UA: NEGATIVE
Blood, UA: NEGATIVE
Glucose, UA: NEGATIVE mg/dL
Nitrite, UA: NEGATIVE
POC PROTEIN,UA: 100 — AB
Spec Grav, UA: 1.025 (ref 1.010–1.025)
Urobilinogen, UA: 0.2 U/dL
pH, UA: 8 (ref 5.0–8.0)

## 2023-11-09 MED ORDER — NITROFURANTOIN MONOHYD MACRO 100 MG PO CAPS: 100.0000 mg | ORAL_CAPSULE | Freq: Two times a day (BID) | ORAL | 0 refills | Status: AC

## 2023-11-09 MED ORDER — FLUCONAZOLE 150 MG PO TABS: 150.0000 mg | ORAL_TABLET | ORAL | 0 refills | Status: AC

## 2023-11-09 NOTE — Discharge Instructions (Addendum)
 Your urinalysis shows Iman Reinertsen blood cells but at this time does not show bacteria, your urine will be sent to the lab to determine exactly which bacteria is present, if any changes need to be made to your medications you will be notified  Begin use of Macrobid  twice daily for 5 days  Give 1 Diflucan  tablet today and then give second dose after completing antibiotic  You may use over-the-counter Azo to help minimize your symptoms until antibiotic removes bacteria, this medication will turn your urine orange  Increase your fluid intake through use of water  As always practice good hygiene, wiping front to back and avoidance of scented vaginal products to prevent further irritation  If symptoms continue to persist after use of medication or recur please follow-up with urgent care or your primary doctor as needed

## 2023-11-09 NOTE — ED Triage Notes (Signed)
 Patient 's daughter report urinary frequency, foul odor and possible blood in urine.x 2 weeks. Daughter also concern about yeast infection. Patient  just finish 2  rounds of  cream for yeast infection.

## 2023-11-10 LAB — CERVICOVAGINAL ANCILLARY ONLY
Bacterial Vaginitis (gardnerella): NEGATIVE
Candida Glabrata: NEGATIVE
Candida Vaginitis: NEGATIVE
Comment: NEGATIVE
Comment: NEGATIVE
Comment: NEGATIVE

## 2023-11-11 ENCOUNTER — Ambulatory Visit (HOSPITAL_COMMUNITY): Payer: Self-pay

## 2023-11-11 LAB — URINE CULTURE: Culture: >=100000 — AB

## 2023-11-12 MED ORDER — SULFAMETHOXAZOLE-TRIMETHOPRIM 800-160 MG PO TABS: 1.0000 | ORAL_TABLET | Freq: Two times a day (BID) | ORAL | 0 refills | Status: AC

## 2023-11-12 NOTE — ED Provider Notes (Signed)
 [facility]    CSN: [id number] Arrival date & time: 11/09/23  1616      History   Chief Complaint Chief Complaint  Patient presents with   Urinary Frequency   Vaginitis    HPI ROSITA GUZZETTA is a 83 y.o. female.   Patient presents for evaluation of urinary frequency, dysuria, foul odor and possible hematuria present for 2 weeks.  Completed treatment for yeast infection approximately 1 month ago with topical medication, somewhat improved.  Denies abdominal pain, flank pain or fever.  History obtained from daughter as patient has dementia.  Past Medical History:  Diagnosis Date   Anxiety and depression    Dementia (HCC)    Diabetes (HCC)    GERD (gastroesophageal reflux disease)    History of GI diverticular bleed    Hypertension    Insomnia    Obesity     There are no active problems to display for this patient.   Past Surgical History:  Procedure Laterality Date   APPENDECTOMY     COLONOSCOPY  01/10/2010   Pancolonic divericulosis. The patient likely had a diverticular bleed which currently has resolved. There was no active bleeding   ESOPHAGOGASTRODUODENOSCOPY  01/08/2010   Prominent gastic folds of questionable importance (biopsied). Otherwise normal EGD   TOTAL ABDOMINAL HYSTERECTOMY W/ BILATERAL SALPINGOOPHORECTOMY      OB History   No obstetric history on file.      Home Medications    Prior to Admission medications   Medication Sig Start Date End Date Taking? Authorizing Provider  fluconazole  (DIFLUCAN ) 150 MG tablet Take 1 tablet (150 mg total) by mouth once a week for 2 doses. 11/09/23 11/17/23 Yes Rosaleah Person R, NP  nitrofurantoin , macrocrystal-monohydrate, (MACROBID ) 100 MG capsule Take 1 capsule (100 mg total) by mouth 2 (two) times daily. 11/09/23  Yes Juris Gosnell R, NP  traZODone (DESYREL) 100 MG tablet TAKE 1 TABLET AT BEDTIME FOR INSOMNIA PLUS 1/2 TABLET AS NEEDED FOR SUNDOWNING 04/10/22  Yes [provider]  aspirin  81 MG chewable tablet Chew by mouth daily.    [provider]  atorvastatin (LIPITOR) 10 MG tablet Take by mouth. Take 1 tablet (Oral) daily    [provider]  Cholecalciferol (VITAMIN D3) 50 MCG (2000 UT) TABS Take by mouth. Take 1 tablet daily    [provider]  Cyanocobalamin  (VITAMIN B12) 500 MCG TABS Take by mouth. Take 1 tablet daily    [provider]  donepezil (ARICEPT) 10 MG tablet Take 1 tablet by mouth daily. 04/30/17   [provider]  felodipine (PLENDIL) 10 MG 24 hr tablet Take 10 mg by mouth daily.    [provider]  hydrALAZINE  (APRESOLINE ) 50 MG tablet Take by mouth. Take 1 tablet(oral) two times daily 04/30/17   [provider]  levothyroxine (SYNTHROID) 25 MCG tablet Take by mouth. Take 1 tablet by mouth every morning 03/17/20   [provider]  Melatonin 5 MG CHEW Chew by mouth. Take 1 tablet daily    [provider]  memantine (NAMENDA) 10 MG tablet Take by mouth. Take 1 tablet by mouth two times a day 04/30/17   [provider]  metFORMIN (GLUCOPHAGE) 1000 MG tablet Take by mouth. Take 1 Tablet (Oral) two times daily 04/30/17   [provider]  metoprolol succinate (TOPROL-XL) 100 MG 24 hr tablet Take 1 tablet by mouth daily 08/10/20   [provider]  Multiple Vitamin (MULTIVITAMIN) tablet Take 1 tablet by  mouth daily.    [provider]  omeprazole (PRILOSEC) 20 MG capsule Take 1 capsule by mouth daily. 04/30/17   [provider]  sertraline (ZOLOFT) 50 MG tablet Take 1 tablet daily 06/02/20   [provider]  vitamin C (ASCORBIC ACID) 500 MG tablet Take 500 mg by mouth daily.    [provider]    Family History Family History  Problem Relation Age of Onset   Heart attack Mother    Prostate cancer Father    Diabetes Father    Colon cancer Neg Hx    Esophageal cancer Neg Hx    Pancreatic cancer Neg Hx    Stomach cancer Neg Hx     Liver disease Neg Hx     Social History Social History   Tobacco Use   Smoking status: Former    Types: Cigarettes   Smokeless tobacco: Never  Vaping Use   Vaping status: Never Used  Substance Use Topics   Alcohol use: Never   Drug use: Never     Allergies   Penicillins   Review of Systems Review of Systems  Genitourinary:  Positive for frequency.     Physical Exam Triage Vital Signs ED Triage Vitals  Encounter Vitals Group     BP 11/09/23 1744 117/70     Girls Systolic BP Percentile --      Girls Diastolic BP Percentile --      Boys Systolic BP Percentile --      Boys Diastolic BP Percentile --      Pulse Rate 11/09/23 1744 74     Resp 11/09/23 1744 18     Temp 11/09/23 1744 98.7 F (37.1 C)     Temp Source 11/09/23 1744 Oral     SpO2 11/09/23 1744 99 %     Weight --      Height --      Head Circumference --      Peak Flow --      Pain Score 11/09/23 1737 0     Pain Loc --      Pain Education --      Exclude from Growth Chart --    No data found.  Updated Vital Signs BP 117/70 (BP Location: Left Arm)   Pulse 74   Temp 98.7 F (37.1 C) (Oral)   Resp 18   SpO2 99%   Visual Acuity Right Eye Distance:   Left Eye Distance:   Bilateral Distance:    Right Eye Near:   Left Eye Near:    Bilateral Near:     Physical Exam Constitutional:      Appearance: Normal appearance.  Eyes:     Extraocular Movements: Extraocular movements intact.  Pulmonary:     Effort: Pulmonary effort is normal.  Abdominal:     Tenderness: There is no abdominal tenderness. There is no right CVA tenderness, left CVA tenderness or guarding.  Neurological:     Mental Status: She is alert and oriented to person, place, and time. Mental status is at baseline.      UC Treatments / Results  Labs (all labs ordered are listed, but only abnormal results are displayed) Labs Reviewed  URINE CULTURE - Abnormal; Notable for the following components:      Result Value    Culture >=100,000 COLONIES/mL PROTEUS MIRABILIS (*)    Organism ID, Bacteria PROTEUS MIRABILIS (*)    All other components within normal limits  POCT URINE DIPSTICK - Abnormal; Notable for  the following components:   Clarity, UA cloudy (*)    Ketones, POC UA trace (5) (*)    POC PROTEIN,UA =100 (*)    Leukocytes, UA Large (3+) (*)    All other components within normal limits  CERVICOVAGINAL ANCILLARY ONLY    EKG   Radiology No results found.  Procedures Procedures (including critical care time)  Medications Ordered in UC Medications - No data to display  Initial Impression / Assessment and Plan / UC Course  I have reviewed the triage vital signs and the nursing notes.  Pertinent labs & imaging results that were available during my care of the patient were reviewed by me and considered in my medical decision making (see chart for details).  Urinary frequency  Urinalysis showed leukocytes, negative for nitrates, sent for culture, empirically placed on Macrobid  and Diflucan , discussed administration, vaginal swab checking for yeast and BV pending, will treat additionally per protocol, advised over-the-counter medications and nonpharmacological supportive care with follow-up with primary doctor if symptoms persist Final Clinical Impressions(s) / UC Diagnoses   Final diagnoses:  Urinary frequency     Discharge Instructions      Your urinalysis shows Dustin Bumbaugh blood cells but at this time does not show bacteria, your urine will be sent to the lab to determine exactly which bacteria is present, if any changes need to be made to your medications you will be notified  Begin use of Macrobid  twice daily for 5 days  Give 1 Diflucan  tablet today and then give second dose after completing antibiotic  You may use over-the-counter Azo to help minimize your symptoms until antibiotic removes bacteria, this medication will turn your urine orange  Increase your fluid intake through use of  water  As always practice good hygiene, wiping front to back and avoidance of scented vaginal products to prevent further irritation  If symptoms continue to persist after use of medication or recur please follow-up with urgent care or your primary doctor as needed    ED Prescriptions     Medication Sig Dispense Auth. Provider   nitrofurantoin , macrocrystal-monohydrate, (MACROBID ) 100 MG capsule Take 1 capsule (100 mg total) by mouth 2 (two) times daily. 10 capsule Terel Bann R, NP   fluconazole  (DIFLUCAN ) 150 MG tablet Take 1 tablet (150 mg total) by mouth once a week for 2 doses. 2 tablet Fantasia Jinkins R, NP      PDMP not reviewed this encounter.   Teresa Shelba SAUNDERS, NP 11/12/23 [phone]
